# Patient Record
Sex: Female | Born: 1960
Health system: Southern US, Community
[De-identification: ages and names within clinical notes are randomized; demographics above are authoritative.]

## PROBLEM LIST (undated history)

## (undated) DIAGNOSIS — L719 Rosacea, unspecified: Secondary | ICD-10-CM

## (undated) DIAGNOSIS — D689 Coagulation defect, unspecified: Secondary | ICD-10-CM

## (undated) DIAGNOSIS — T7840XA Allergy, unspecified, initial encounter: Secondary | ICD-10-CM

## (undated) DIAGNOSIS — I82409 Acute embolism and thrombosis of unspecified deep veins of unspecified lower extremity: Secondary | ICD-10-CM

## (undated) HISTORY — DX: Allergy, unspecified, initial encounter: T78.40XA

## (undated) HISTORY — PX: BREAST CYST ASPIRATION: SHX578

## (undated) HISTORY — DX: Coagulation defect, unspecified: D68.9

---

## 2000-04-22 ENCOUNTER — Ambulatory Visit (HOSPITAL_COMMUNITY): Admission: RE | Admit: 2000-04-22 | Discharge: 2000-04-22 | Payer: Self-pay | Admitting: Family Medicine

## 2001-04-16 ENCOUNTER — Ambulatory Visit (HOSPITAL_COMMUNITY): Admission: RE | Admit: 2001-04-16 | Discharge: 2001-04-16 | Payer: Self-pay | Admitting: Family Medicine

## 2003-01-16 ENCOUNTER — Ambulatory Visit (HOSPITAL_COMMUNITY): Admission: RE | Admit: 2003-01-16 | Discharge: 2003-01-16 | Payer: Self-pay | Admitting: Family Medicine

## 2005-03-24 ENCOUNTER — Ambulatory Visit (HOSPITAL_COMMUNITY): Admission: RE | Admit: 2005-03-24 | Discharge: 2005-03-24 | Payer: Self-pay | Admitting: Family Medicine

## 2006-01-27 ENCOUNTER — Encounter: Admission: RE | Admit: 2006-01-27 | Discharge: 2006-01-27 | Payer: Self-pay | Admitting: Family Medicine

## 2011-03-24 ENCOUNTER — Emergency Department (HOSPITAL_BASED_OUTPATIENT_CLINIC_OR_DEPARTMENT_OTHER)
Admission: EM | Admit: 2011-03-24 | Discharge: 2011-03-24 | Disposition: A | Discharge status: Home / Self Care | Payer: 59 | Attending: Emergency Medicine | Admitting: Emergency Medicine

## 2011-03-24 ENCOUNTER — Encounter: Payer: Self-pay | Admitting: Student

## 2011-03-24 ENCOUNTER — Emergency Department (INDEPENDENT_AMBULATORY_CARE_PROVIDER_SITE_OTHER): Payer: 59

## 2011-03-24 DIAGNOSIS — M25579 Pain in unspecified ankle and joints of unspecified foot: Secondary | ICD-10-CM

## 2011-03-24 DIAGNOSIS — W19XXXA Unspecified fall, initial encounter: Secondary | ICD-10-CM

## 2011-03-24 DIAGNOSIS — W101XXA Fall (on)(from) sidewalk curb, initial encounter: Secondary | ICD-10-CM | POA: Insufficient documentation

## 2011-03-24 DIAGNOSIS — S92213A Displaced fracture of cuboid bone of unspecified foot, initial encounter for closed fracture: Secondary | ICD-10-CM | POA: Insufficient documentation

## 2011-03-24 DIAGNOSIS — S92253A Displaced fracture of navicular [scaphoid] of unspecified foot, initial encounter for closed fracture: Secondary | ICD-10-CM

## 2011-03-24 HISTORY — DX: Rosacea, unspecified: L71.9

## 2011-03-24 MED ORDER — HYDROCODONE-ACETAMINOPHEN 5-500 MG PO TABS: 1.0000 | ORAL_TABLET | Freq: Four times a day (QID) | ORAL | Status: AC | PRN

## 2011-03-24 NOTE — ED Provider Notes (Signed)
History     Chief Complaint  Patient presents with  . Foot Injury    left ankle   HPI Comments: Inversion injury to left ankle this morning.  [place]  The history is provided by the patient.    Past Medical History  Diagnosis Date  . Rosacea     History reviewed. No pertinent past surgical history.  History reviewed. No pertinent family history.  History  Substance Use Topics  . Smoking status: Never Smoker   . Smokeless tobacco: Never Used  . Alcohol Use: No    OB History    Grav Para Term Preterm Abortions TAB SAB Ect Mult Living                  Review of Systems  Constitutional: Negative for fever, chills, activity change and appetite change.  Musculoskeletal:       Pain, swelling to left ankle and foot.  Neurological: Negative for numbness.    Physical Exam  BP 118/71  Pulse 66  Temp(Src) 98 F (36.7 C) (Oral)  Resp 20  Wt 170 lb (77.111 kg)  LMP 02/12/2011  Physical Exam  Constitutional: She appears well-developed and well-nourished. No distress.  HENT:  Head: Normocephalic and atraumatic.  Musculoskeletal:       The left ankle is noted to have swelling, ecchymosis over the inferior aspect of the lateral malleolus and proximal foot.  There is no medial malleolar ttp.  Skin: She is not diaphoretic.    ED Course  Procedures  MDM Small avulsion fractures noted on the xrays.  Will place in a camwalker and crutches.      Riley Lam [facility] 03/25/11 [phone]

## 2011-03-24 NOTE — ED Notes (Signed)
Pt in with c/o left foot and ankle pain s/p stepping and twisting injury off of sidewalk.

## 2011-07-03 ENCOUNTER — Other Ambulatory Visit: Payer: Self-pay | Admitting: Obstetrics and Gynecology

## 2011-07-03 DIAGNOSIS — N63 Unspecified lump in unspecified breast: Secondary | ICD-10-CM

## 2011-07-16 ENCOUNTER — Ambulatory Visit
Admission: RE | Admit: 2011-07-16 | Discharge: 2011-07-16 | Disposition: A | Discharge status: Home / Self Care | Payer: 59 | Source: Ambulatory Visit | Attending: Obstetrics and Gynecology | Admitting: Obstetrics and Gynecology

## 2011-07-16 ENCOUNTER — Other Ambulatory Visit: Payer: Self-pay | Admitting: Obstetrics and Gynecology

## 2011-07-16 DIAGNOSIS — N63 Unspecified lump in unspecified breast: Secondary | ICD-10-CM

## 2011-07-21 ENCOUNTER — Ambulatory Visit
Admission: RE | Admit: 2011-07-21 | Discharge: 2011-07-21 | Disposition: A | Discharge status: Home / Self Care | Payer: 59 | Source: Ambulatory Visit | Attending: Obstetrics and Gynecology | Admitting: Obstetrics and Gynecology

## 2011-07-21 DIAGNOSIS — N63 Unspecified lump in unspecified breast: Secondary | ICD-10-CM

## 2012-07-12 ENCOUNTER — Other Ambulatory Visit: Payer: Self-pay | Admitting: Obstetrics and Gynecology

## 2012-07-12 DIAGNOSIS — N6322 Unspecified lump in the left breast, upper inner quadrant: Secondary | ICD-10-CM

## 2012-07-19 ENCOUNTER — Ambulatory Visit
Admission: RE | Admit: 2012-07-19 | Discharge: 2012-07-19 | Disposition: A | Discharge status: Home / Self Care | Payer: 59 | Source: Ambulatory Visit | Attending: Obstetrics and Gynecology | Admitting: Obstetrics and Gynecology

## 2012-07-19 DIAGNOSIS — N6322 Unspecified lump in the left breast, upper inner quadrant: Secondary | ICD-10-CM

## 2013-06-21 ENCOUNTER — Other Ambulatory Visit: Payer: Self-pay

## 2013-06-21 DIAGNOSIS — Z1231 Encounter for screening mammogram for malignant neoplasm of breast: Secondary | ICD-10-CM

## 2013-07-22 ENCOUNTER — Ambulatory Visit
Admission: RE | Admit: 2013-07-22 | Discharge: 2013-07-22 | Disposition: A | Discharge status: Home / Self Care | Payer: 59 | Source: Ambulatory Visit

## 2013-07-22 DIAGNOSIS — Z1231 Encounter for screening mammogram for malignant neoplasm of breast: Secondary | ICD-10-CM

## 2013-07-29 ENCOUNTER — Other Ambulatory Visit: Payer: Self-pay | Admitting: Obstetrics and Gynecology

## 2013-07-29 DIAGNOSIS — R928 Other abnormal and inconclusive findings on diagnostic imaging of breast: Secondary | ICD-10-CM

## 2013-08-15 ENCOUNTER — Ambulatory Visit
Admission: RE | Admit: 2013-08-15 | Discharge: 2013-08-15 | Disposition: A | Discharge status: Home / Self Care | Payer: 59 | Source: Ambulatory Visit | Attending: Obstetrics and Gynecology | Admitting: Obstetrics and Gynecology

## 2013-08-15 DIAGNOSIS — R928 Other abnormal and inconclusive findings on diagnostic imaging of breast: Secondary | ICD-10-CM

## 2015-09-20 ENCOUNTER — Telehealth: Payer: Self-pay | Admitting: General Practice

## 2015-09-20 NOTE — Telephone Encounter (Signed)
Relation to NW:GNFA Call back [phone]   Reason for call:  Patient would like to establish care with Dr. Beverely Low, patient is aware MD is relocating. Please advise

## 2015-09-20 NOTE — Telephone Encounter (Signed)
Ok to establish 

## 2015-09-20 NOTE — Telephone Encounter (Signed)
Patient scheduled for 09/21/2015

## 2015-09-21 ENCOUNTER — Ambulatory Visit (INDEPENDENT_AMBULATORY_CARE_PROVIDER_SITE_OTHER): Payer: 59 | Admitting: Family Medicine

## 2015-09-21 ENCOUNTER — Encounter: Payer: Self-pay | Admitting: Family Medicine

## 2015-09-21 ENCOUNTER — Ambulatory Visit (HOSPITAL_BASED_OUTPATIENT_CLINIC_OR_DEPARTMENT_OTHER)
Admission: RE | Admit: 2015-09-21 | Discharge: 2015-09-21 | Disposition: A | Discharge status: Home / Self Care | Payer: 59 | Source: Ambulatory Visit | Attending: Family Medicine | Admitting: Family Medicine

## 2015-09-21 VITALS — BP 140/79 | HR 82 | Temp 98.5°F | Resp 16 | Ht 69.0 in | Wt 195.0 lb

## 2015-09-21 DIAGNOSIS — I82442 Acute embolism and thrombosis of left tibial vein: Secondary | ICD-10-CM | POA: Insufficient documentation

## 2015-09-21 DIAGNOSIS — M25572 Pain in left ankle and joints of left foot: Secondary | ICD-10-CM | POA: Insufficient documentation

## 2015-09-21 DIAGNOSIS — M7989 Other specified soft tissue disorders: Secondary | ICD-10-CM | POA: Insufficient documentation

## 2015-09-21 DIAGNOSIS — I803 Phlebitis and thrombophlebitis of lower extremities, unspecified: Secondary | ICD-10-CM | POA: Diagnosis not present

## 2015-09-21 DIAGNOSIS — I80292 Phlebitis and thrombophlebitis of other deep vessels of left lower extremity: Secondary | ICD-10-CM

## 2015-09-21 DIAGNOSIS — I8002 Phlebitis and thrombophlebitis of superficial vessels of left lower extremity: Secondary | ICD-10-CM | POA: Diagnosis not present

## 2015-09-21 MED ORDER — IBUPROFEN 600 MG PO TABS: 600.0000 mg | ORAL_TABLET | Freq: Three times a day (TID) | ORAL | Status: DC | PRN

## 2015-09-21 MED ORDER — RIVAROXABAN (XARELTO) VTE STARTER PACK (15 & 20 MG): ORAL_TABLET | ORAL | Status: DC

## 2015-09-21 NOTE — Progress Notes (Signed)
   Subjective:    Patient ID: Hannah Day, female    DOB: Nov 05, 1960, 55 y.o.   MRN: [id number]  HPI New to establish.  Previous MD- Yehuda Budd.  GYN- Adkins    Clotting disorder- pt has hx of superficial clots.  Not currently on ASA.  Has never been on blood thinner.  Mom w/ hx of clots- pt thinks it was Factor V Leiden.  Now w/ area of soreness on L lower leg.  sxs started Monday night w/ pain in calf.  Then developed redness, warmth, TTP.  Calf is no longer hurting- pain and swelling has moved distally to medial malleolus.  No SOB, CP, palpitations, no fevers.  Taking Ibuprofen.  Pt reports 3-4 previous documented clots but possibility of many more.   Review of Systems For ROS see HPI     Objective:   Physical Exam  Constitutional: She is oriented to person, place, and time. She appears well-developed and well-nourished. No distress.  HENT:  Head: Normocephalic and atraumatic.  Eyes: Conjunctivae and EOM are normal. Pupils are equal, round, and reactive to light.  Cardiovascular: Normal rate, regular rhythm, normal heart sounds and intact distal pulses.   Pulmonary/Chest: Effort normal and breath sounds normal. No respiratory distress. She has no wheezes. She has no rales.  Musculoskeletal: She exhibits edema (L lower extremity edema w/ TTP and warmer than proximal lower leg) and tenderness.  Neurological: She is alert and oriented to person, place, and time.  Psychiatric: She has a normal mood and affect. Her behavior is normal. Thought content normal.  Vitals reviewed.         Assessment & Plan:

## 2015-09-21 NOTE — Patient Instructions (Signed)
You have an Ultrasound at 5:30 downstairs on the First Floor (right beside elevator) Start the Ibuprofen 3x/day (w/ food) for pain and inflammation Hot compresses to lower leg We'll call you with your hematology appt for the clotting workup Call with any questions or concerns Hang in there!!!

## 2015-09-21 NOTE — Progress Notes (Signed)
Pre visit review using our clinic review tool, if applicable. No additional management support is needed unless otherwise documented below in the visit note. 

## 2015-09-23 NOTE — Assessment & Plan Note (Signed)
New.  Pt reports hx of multiple clots and mother had clotting disorder.  Denies hx of DVT.  No records to review.  Will get venous doppler to assess superficial vs deep clot.  Will plan on NSAIDs for superficial clot and if DVT, will need xarelto.  Refer to hematology for complete evaluation and tx.  Pt expressed understanding and is in agreement w/ plan.

## 2015-09-24 ENCOUNTER — Ambulatory Visit: Payer: Self-pay | Admitting: Family Medicine

## 2015-10-09 ENCOUNTER — Other Ambulatory Visit: Payer: Self-pay

## 2015-10-09 ENCOUNTER — Telehealth: Payer: Self-pay | Admitting: Family Medicine

## 2015-10-09 NOTE — Telephone Encounter (Signed)
Called patient to see why she needeed another [employer]. Left message for cal back

## 2015-10-09 NOTE — Telephone Encounter (Addendum)
Relation to ZO:XWRU Call back [phone] Pharmacy: [id number] - [place], [place] - [address] [phone] (Phone) [phone] (Fax)         Reason for call:  Patient requesting a refill Rivaroxaban (XARELTO STARTER PACK) 15 & 20 MG

## 2015-10-10 NOTE — Telephone Encounter (Signed)
Patient not finished with starter pack. Advised when she gets to 2-3 days left of 20 mg, call pharmacy and they will contact us for refill.

## 2015-10-15 ENCOUNTER — Telehealth: Payer: Self-pay

## 2015-10-15 ENCOUNTER — Other Ambulatory Visit: Payer: Self-pay

## 2015-10-15 MED ORDER — RIVAROXABAN 20 MG PO TABS: 20.0000 mg | ORAL_TABLET | Freq: Every day | ORAL | Status: DC

## 2015-10-15 NOTE — Telephone Encounter (Signed)
Received refill request for XARELTO. May patient continue the medication. Was started on Starter pack

## 2015-10-15 NOTE — Telephone Encounter (Signed)
Pt now needs  once daily, #30, 3 refills

## 2015-10-15 NOTE — Telephone Encounter (Signed)
Ordered

## 2015-10-16 ENCOUNTER — Encounter (HOSPITAL_BASED_OUTPATIENT_CLINIC_OR_DEPARTMENT_OTHER): Payer: Self-pay | Admitting: *Deleted

## 2015-10-16 ENCOUNTER — Emergency Department (HOSPITAL_BASED_OUTPATIENT_CLINIC_OR_DEPARTMENT_OTHER)
Admission: EM | Admit: 2015-10-16 | Discharge: 2015-10-16 | Disposition: A | Discharge status: Home / Self Care | Payer: 59 | Attending: Emergency Medicine | Admitting: Emergency Medicine

## 2015-10-16 ENCOUNTER — Telehealth: Payer: Self-pay | Admitting: Family Medicine

## 2015-10-16 DIAGNOSIS — M791 Myalgia: Secondary | ICD-10-CM | POA: Diagnosis not present

## 2015-10-16 DIAGNOSIS — M79604 Pain in right leg: Secondary | ICD-10-CM | POA: Diagnosis present

## 2015-10-16 DIAGNOSIS — M79652 Pain in left thigh: Secondary | ICD-10-CM | POA: Insufficient documentation

## 2015-10-16 DIAGNOSIS — Z7901 Long term (current) use of anticoagulants: Secondary | ICD-10-CM | POA: Insufficient documentation

## 2015-10-16 DIAGNOSIS — Z862 Personal history of diseases of the blood and blood-forming organs and certain disorders involving the immune mechanism: Secondary | ICD-10-CM | POA: Insufficient documentation

## 2015-10-16 DIAGNOSIS — Z79899 Other long term (current) drug therapy: Secondary | ICD-10-CM | POA: Diagnosis not present

## 2015-10-16 DIAGNOSIS — M7989 Other specified soft tissue disorders: Secondary | ICD-10-CM | POA: Diagnosis not present

## 2015-10-16 DIAGNOSIS — Z872 Personal history of diseases of the skin and subcutaneous tissue: Secondary | ICD-10-CM | POA: Diagnosis not present

## 2015-10-16 DIAGNOSIS — R238 Other skin changes: Secondary | ICD-10-CM | POA: Insufficient documentation

## 2015-10-16 DIAGNOSIS — Z86718 Personal history of other venous thrombosis and embolism: Secondary | ICD-10-CM | POA: Diagnosis not present

## 2015-10-16 HISTORY — DX: Acute embolism and thrombosis of unspecified deep veins of unspecified lower extremity: I82.409

## 2015-10-16 NOTE — Discharge Instructions (Signed)
Place a warm damp washcloth over the area on your left thigh 4 times daily 30 minutes of time or sit in warm bathtub or let the warm water from the shower hit the area 4 times daily for 30 minutes at a time. Return or see your physician if you feel worse for any reason.

## 2015-10-16 NOTE — ED Provider Notes (Signed)
CSN: [id number]     Arrival date & time 10/16/15  1539 History   First MD Initiated Contact with Patient 10/16/15 1746     Chief Complaint  Patient presents with  . Leg Pain     (Consider location/radiation/quality/duration/timing/severity/associated sxs/prior Treatment) HPI Patient noted a "bulge" at her left proximal thigh yesterday,, causing mild discomfort when her jeans rub against her skin or when she presses on the area. She denies fever. She is concerned that she may have a blood clot as she's been diagnosed with DVT last month and has been on Xarelto for approximately one month. No other associated symptoms. No shortness of breath no chest pain no fever. She has no discomfort when she does not press on the area. Past Medical History  Diagnosis Date  . Rosacea   . Clotting disorder (HCC)   . DVT (deep venous thrombosis) (HCC)    History reviewed. No pertinent past surgical history. Family History  Problem Relation Age of Onset  . Cancer Father    Social History  Substance Use Topics  . Smoking status: Never Smoker   . Smokeless tobacco: Never Used  . Alcohol Use: No   OB History    No data available     Review of Systems  Constitutional: Negative.   HENT: Negative.   Respiratory: Negative.   Cardiovascular: Positive for leg swelling.       Chronic bilateral leg edema  Gastrointestinal: Negative.   Musculoskeletal: Positive for myalgias.       Left thigh pain  Skin: Negative.   Neurological: Negative.   Hematological: Bruises/bleeds easily.  Psychiatric/Behavioral: Negative.   All other systems reviewed and are negative.     Allergies  Sulfa antibiotics  Home Medications   Prior to Admission medications   Medication Sig Start Date End Date Taking? Authorizing Provider  Black Cohosh 40 MG CAPS Take 1 capsule by mouth 2 (two) times daily.   Yes Historical Provider, MD  doxycycline (ORACEA) 40 MG capsule Take 40 mg by mouth every morning.     Yes  Historical Provider, MD  Rivaroxaban (XARELTO STARTER PACK) 15 & 20 MG TBPK Start with one  tab by mouth twice a day w/ food. On Day 22, switch to one  tablet once a day w/ food. 09/21/15  Yes Sheliah Hatch, MD  ibuprofen (ADVIL,MOTRIN) 600 MG tablet Take 1 tablet (600 mg total) by mouth every 8 (eight) hours as needed. 09/21/15   Sheliah Hatch, MD  rivaroxaban (XARELTO) 20 MG TABS tablet Take 1 tablet (20 mg total) by mouth daily with supper. 10/15/15   Sheliah Hatch, MD   BP 148/93 mmHg  Pulse 78  Temp(Src) 98.2 F (36.8 C) (Oral)  Resp 18  Wt 190 lb (86.183 kg)  SpO2 100% Physical Exam  Constitutional: She appears well-developed and well-nourished.  HENT:  Head: Normocephalic and atraumatic.  Eyes: Conjunctivae and EOM are normal.  Neck: Neck supple.  Cardiovascular: Normal rate.   Pulmonary/Chest: Effort normal. No respiratory distress.  Abdominal: She exhibits no distension.  Musculoskeletal: Normal range of motion. She exhibits edema.  1+ pretibial edema bilaterally. DP pulses 2+ bilaterally Lower extremity there is a single pimple located at the proximal thigh 3 cm distal to the inguinal crease which is minimally tender, no surrounding redness warmth or tenderness. All other extremities without redness  or tenderness neurovascular intact  Neurological: She is alert. Coordination normal.  Skin: Skin is warm and dry. No rash noted.  Psychiatric: She has a normal mood and affect.  Nursing note and vitals reviewed.   ED Course  Procedures (including critical care time) Labs Review Labs Reviewed - No data to display  Imaging Review No results found. I have personally reviewed and evaluated these images and lab results as part of my medical decision-making.   EKG Interpretation None      MDM  Patient could not visualize the area on her left thigh and she is satisfied there is a pimple there. Does not feel like prior blood clot. Does not require I&D.  Area is tiny 1 or 2 mm. Suggest warm compresses. Return or follow up with Dr.Tabouri as needed. Final diagnoses:  None        Doug Sou, MD 10/16/15 1801

## 2015-10-16 NOTE — Telephone Encounter (Signed)
[facility] - Client TELEPHONE ADVICE RECORD [facility] Patient Name: Hannah Day DOB: [birth year] Initial Comment Caller states she was prescribed Xarelto for blood clot in leg last month, has hard raised vein and leg pain again Nurse Assessment Nurse: Lane Hacker, RN, Windy Date/Time (Eastern Time): 10/16/2015 2:36:00 PM Confirm and document reason for call. If symptomatic, describe symptoms. You must click the next button to save text entered. ---Caller states she was prescribed Xarelto for blood clot in left leg last month. She has hard raised vein and leg pain in the opposite leg now -- in upper thigh of right leg. There is a little bruising around it. No cp or sob. Has the patient traveled out of the country within the last 30 days? ---Not Applicable Does the patient have any new or worsening symptoms? ---Yes Will a triage be completed? ---Yes Related visit to physician within the last 2 weeks? ---No Does the PT have any chronic conditions? (i.e. diabetes, asthma, etc.) ---Yes List chronic conditions. ---DVT 2017, superficial blood clots in the past Is the patient pregnant or possibly pregnant? (Ask all females between the ages of 9-55) ---No Is this a behavioral health or substance abuse call? ---No Guidelines Guideline Title Affirmed Question Affirmed Notes Leg Pain History of prior "blood clot" in leg or lungs (i.e., deep vein thrombosis, pulmonary embolism) Final Disposition User See Physician within 4 Hours (or PCP triage) Lane Hacker, RN, Elvin So Comments Midly tender to touch, and otherwise no real pain even when walking. Area is raised with a slight bulge. No available appts today. - checked with office. They spoke with Dr. Alfonso Ramus and she advised that pt go to ER as she may need a different blood thinner. -- As advised by Marj at office Referrals GO TO FACILITY UNDECIDED Disagree/Comply: Comply

## 2015-10-16 NOTE — ED Notes (Signed)
Patient complaining of bulging area in right lower groin. Pt states from what she can see it looks bruised.

## 2015-10-17 ENCOUNTER — Ambulatory Visit: Payer: 59

## 2015-10-17 ENCOUNTER — Ambulatory Visit: Payer: 59 | Admitting: Hematology & Oncology

## 2015-10-17 ENCOUNTER — Other Ambulatory Visit: Payer: 59

## 2015-10-18 ENCOUNTER — Other Ambulatory Visit: Payer: Self-pay

## 2015-10-22 ENCOUNTER — Ambulatory Visit: Payer: 59 | Admitting: Hematology & Oncology

## 2015-10-22 ENCOUNTER — Other Ambulatory Visit: Payer: 59

## 2015-10-22 ENCOUNTER — Ambulatory Visit: Payer: 59

## 2015-10-29 ENCOUNTER — Ambulatory Visit (HOSPITAL_BASED_OUTPATIENT_CLINIC_OR_DEPARTMENT_OTHER): Payer: 59 | Admitting: Hematology & Oncology

## 2015-10-29 ENCOUNTER — Encounter: Payer: Self-pay | Admitting: Hematology & Oncology

## 2015-10-29 ENCOUNTER — Ambulatory Visit: Payer: 59

## 2015-10-29 ENCOUNTER — Other Ambulatory Visit (HOSPITAL_BASED_OUTPATIENT_CLINIC_OR_DEPARTMENT_OTHER): Payer: 59

## 2015-10-29 VITALS — BP 145/73 | HR 65 | Temp 98.0°F | Resp 16 | Ht 69.0 in | Wt 196.0 lb

## 2015-10-29 DIAGNOSIS — I803 Phlebitis and thrombophlebitis of lower extremities, unspecified: Secondary | ICD-10-CM | POA: Diagnosis not present

## 2015-10-29 DIAGNOSIS — I80292 Phlebitis and thrombophlebitis of other deep vessels of left lower extremity: Secondary | ICD-10-CM

## 2015-10-29 DIAGNOSIS — I824Z2 Acute embolism and thrombosis of unspecified deep veins of left distal lower extremity: Secondary | ICD-10-CM | POA: Insufficient documentation

## 2015-10-29 LAB — COMPREHENSIVE METABOLIC PANEL (CC13)
ALK PHOS: 80 IU/L (ref 39–117)
ALT: 19 IU/L (ref 0–32)
AST: 16 IU/L (ref 0–40)
Albumin, Serum: 4.1 g/dL (ref 3.5–5.5)
Albumin/Globulin Ratio: 1.5 (ref 1.1–2.5)
BUN/Creatinine Ratio: 18 (ref 9–23)
BUN: 14 mg/dL (ref 6–24)
Bilirubin Total: 0.2 mg/dL (ref 0.0–1.2)
CALCIUM: 9.1 mg/dL (ref 8.7–10.2)
CO2: 25 mmol/L (ref 18–29)
CREATININE: 0.76 mg/dL (ref 0.57–1.00)
Chloride, Ser: 104 mmol/L (ref 96–106)
GFR calc Af Amer: 103 mL/min/{1.73_m2} (ref >59)
GFR, EST NON AFRICAN AMERICAN: 89 mL/min/{1.73_m2} (ref >59)
GLOBULIN, TOTAL: 2.8 g/dL (ref 1.5–4.5)
GLUCOSE: 92 mg/dL (ref 65–99)
Potassium, Ser: 3.7 mmol/L (ref 3.5–5.2)
SODIUM: 139 mmol/L (ref 134–144)
Total Protein: 6.9 g/dL (ref 6.0–8.5)

## 2015-10-29 LAB — CBC WITH DIFFERENTIAL (CANCER CENTER ONLY)
BASO#: 0 10*3/uL (ref 0.0–0.2)
BASO%: 0.6 % (ref 0.0–2.0)
EOS ABS: 0.1 10*3/uL (ref 0.0–0.5)
EOS%: 1.9 % (ref 0.0–7.0)
HCT: 38.7 % (ref 34.8–46.6)
HEMOGLOBIN: 12.6 g/dL (ref 11.6–15.9)
LYMPH#: 2.8 10*3/uL (ref 0.9–3.3)
LYMPH%: 43.7 % (ref 14.0–48.0)
MCH: 30.7 pg (ref 26.0–34.0)
MCHC: 32.6 g/dL (ref 32.0–36.0)
MCV: 94 fL (ref 81–101)
MONO#: 0.5 10*3/uL (ref 0.1–0.9)
MONO%: 7.3 % (ref 0.0–13.0)
NEUT#: 3 10*3/uL (ref 1.5–6.5)
NEUT%: 46.5 % (ref 39.6–80.0)
Platelets: 224 10*3/uL (ref 145–400)
RBC: 4.1 10*6/uL (ref 3.70–5.32)
RDW: 13.2 % (ref 11.1–15.7)
WBC: 6.4 10*3/uL (ref 3.9–10.0)

## 2015-10-29 NOTE — Progress Notes (Signed)
Referral MD  Reason for Referral: Recurrent superficial thrombosis of the legs with new deep vein thrombosis of the left leg.   Chief Complaint  Patient presents with  . OTHER    New Patient  : I now have a blood clot in my left leg.  HPI: Hannah Day is a very charming 55 year old white female. I actually took care of her mom who passed away from myelofibrosis. Her mom had protein S deficiency.  Hannah Day has had superficial thrombosis in her legs. She's had 4 episodes. She probably has had these over a span of about 20 years.  She has had 2 pregnancies. Both went to term area and she's never had miscarriages.  Recently, she had some pain and swelling in the left leg. She thought that she had another superficial thrombus. She underwent a Doppler. Surprisingly enough, the Doppler did show a superficial thrombus but this seemed to extend into the Left posterior tibial vein. This appear to be isolated into the vein and not into the thigh. The Doppler was done on January 20.  She was placed on Xarelto. She's done well. Her leg is swollen. It is not as painful.  Because of the family history with her mother, is felt that Hannah Day needed be seen by hematology. As such, she is kindly referred to the cancer Center for an evaluation.  She's working without difficulty. She does travel. She has were compression stockings when she travels.  She's not smoking. She does not take any estrogens. She said that her first superficial thrombus occurred after taking some estrogens.  She does try to stay active. She does not exercise much that she would like.  She's had no surgery. Her gynecologic organs are still in place.  She's had no weight loss or weight gain.  There is no other history of thrombosis in the family.  Her father passed away last 01-Jun-2023 of lung cancer.  She does get her mammograms.  Overall, her performance status is ECOG 0.                Past Medical  History  Diagnosis Date  . Rosacea   . Clotting disorder (HCC)   . DVT (deep venous thrombosis) (HCC)   :  No past surgical history on file.:   Current outpatient prescriptions:  .  acetaminophen (TYLENOL) 500 MG tablet, Take 500 mg by mouth every 6 (six) hours as needed., Disp: , Rfl:  .  Black Cohosh 40 MG CAPS, Take 1 capsule by mouth 2 (two) times daily., Disp: , Rfl:  .  cetirizine (ZYRTEC) 10 MG tablet, Take 10 mg by mouth as needed for allergies., Disp: , Rfl:  .  doxycycline (ORACEA) 40 MG capsule, Take 40 mg by mouth every morning.  , Disp: , Rfl:  .  rivaroxaban (XARELTO) 20 MG TABS tablet, Take 1 tablet (20 mg total) by mouth daily with supper., Disp: 30 tablet, Rfl: 3:  :  Allergies  Allergen Reactions  . Sulfa Antibiotics Hives  :  Family History  Problem Relation Age of Onset  . Cancer Father   :  Social History   Social History  . Marital Status: Married    Spouse Name: N/A  . Number of Children: N/A  . Years of Education: N/A   Occupational History  . Not on file.   Social History Main Topics  . Smoking status: Never Smoker   . Smokeless tobacco: Never Used  . Alcohol Use: No  .  Drug Use: No  . Sexual Activity: Yes   Other Topics Concern  . Not on file   Social History Narrative  :  Pertinent items are noted in HPI.  Exam: @  well-developed and well-nourished white female in no obvious distress. Vital signs show temperature of 98. Pulse 65. Blood pressure 145/73. Weight is 196 pounds. Head and neck exam shows no ocular or oral lesions. She has no palpable cervical or supraclavicular lymph nodes. Lungs are clear. Cardiac exam regular rate and rhythm with no murmurs, rubs or bruits. Abdomen is soft. She has good out sounds. There is no fluid wave. There is no palpable hepatosplenomegaly. Back exam shows no tenderness over the spine, ribs or hips. Extremities shows no clubbing, cyanosis or edema. She has no palpable venous cord in the  legs. She has negative Homans sign. She has good pulses in her distal extremities. Skin exam shows no rashes, ecchymoses or petechia. Neurological exam shows no focal neurological deficits.    Recent Labs  10/29/15 1449  WBC 6.4  HGB 12.6  HCT 38.7  PLT 224   No results for input(s): NA, K, CL, CO2, GLUCOSE, BUN, CREATININE, CALCIUM in the last 72 hours.  Blood smear review:  None  Pathology: None     Assessment and Plan:  Hannah Day is a very charming 55 year old white female. She has a thrombus in the left posterior tibial vein. She has a history of superficial thrombosis.  She is on Xarelto.  He'll be interesting to see what her hypercoagulable studies show. Again, I think her mom had protein S deficiency.  As far as duration of anticoagulation, I think that 6 months probably would be reasonable given that there probably is a thrombophilic condition. I would not put her on lifelong anticoagulation as she is only had superficial thrombosis prior to this.  I spent about 45 minutes with her. It was nice talking to her. It has been a while since I have seen her.  I will plan to see her back in 2 months. I will get a Doppler we see her back.

## 2015-10-30 LAB — CARDIOLIPIN ANTIBODIES, IGG, IGM, IGA: Anticardiolipin Ab,IgA,Qn: <9 APL U/mL (ref 0–11)

## 2015-10-31 LAB — PROTEIN C, TOTAL: PROTEIN C ANTIGEN: 86 % (ref 60–150)

## 2015-10-31 LAB — LUPUS ANTICOAGULANT PANEL
PTT-LA: 39.8 s (ref 0.0–43.6)
dRVVT Confirm: 1.5 ratio — ABNORMAL HIGH (ref 0.8–1.2)
dRVVT Mix: 75.5 s — ABNORMAL HIGH (ref 0.0–44.0)
dRVVT: 103.2 s — ABNORMAL HIGH (ref 0.0–44.0)

## 2015-10-31 LAB — ANTITHROMBIN III: Antithrombin Activity: 162 % — ABNORMAL HIGH (ref 75–135)

## 2015-10-31 LAB — BETA-2-GLYCOPROTEIN I ABS, IGG/M/A
Beta-2 Glyco 1 IgA: <9 GPI IgA units (ref 0–25)
Beta-2 Glyco 1 IgM: <9 GPI IgM units (ref 0–32)

## 2015-10-31 LAB — PROTEIN C ACTIVITY: Protein C-Functional: 122 % (ref 73–180)

## 2015-10-31 LAB — PROTEIN S ACTIVITY: Protein S-Functional: 253 % — ABNORMAL HIGH (ref 63–140)

## 2015-10-31 LAB — PROTEIN S, TOTAL: PROTEIN S AG TOTAL: 163 % — AB (ref 60–150)

## 2015-11-02 LAB — FACTOR 5 LEIDEN

## 2015-11-05 LAB — PROTHROMBIN GENE MUTATION

## 2015-11-06 ENCOUNTER — Encounter: Payer: Self-pay | Admitting: Hematology & Oncology

## 2015-12-26 ENCOUNTER — Other Ambulatory Visit (HOSPITAL_BASED_OUTPATIENT_CLINIC_OR_DEPARTMENT_OTHER): Payer: 59

## 2015-12-26 ENCOUNTER — Ambulatory Visit (HOSPITAL_BASED_OUTPATIENT_CLINIC_OR_DEPARTMENT_OTHER)
Admission: RE | Admit: 2015-12-26 | Discharge: 2015-12-26 | Disposition: A | Discharge status: Home / Self Care | Payer: 59 | Source: Ambulatory Visit | Attending: Hematology & Oncology | Admitting: Hematology & Oncology

## 2015-12-26 ENCOUNTER — Ambulatory Visit (HOSPITAL_BASED_OUTPATIENT_CLINIC_OR_DEPARTMENT_OTHER): Payer: 59 | Admitting: Hematology & Oncology

## 2015-12-26 ENCOUNTER — Encounter: Payer: Self-pay | Admitting: Hematology & Oncology

## 2015-12-26 VITALS — BP 140/65 | HR 76 | Temp 98.3°F | Wt 198.0 lb

## 2015-12-26 DIAGNOSIS — I824Z1 Acute embolism and thrombosis of unspecified deep veins of right distal lower extremity: Secondary | ICD-10-CM | POA: Diagnosis not present

## 2015-12-26 DIAGNOSIS — I80292 Phlebitis and thrombophlebitis of other deep vessels of left lower extremity: Secondary | ICD-10-CM

## 2015-12-26 DIAGNOSIS — I803 Phlebitis and thrombophlebitis of lower extremities, unspecified: Secondary | ICD-10-CM | POA: Diagnosis present

## 2015-12-26 DIAGNOSIS — I82812 Embolism and thrombosis of superficial veins of left lower extremities: Secondary | ICD-10-CM | POA: Insufficient documentation

## 2015-12-26 DIAGNOSIS — I824Z2 Acute embolism and thrombosis of unspecified deep veins of left distal lower extremity: Secondary | ICD-10-CM

## 2015-12-26 LAB — COMPREHENSIVE METABOLIC PANEL
ALK PHOS: 79 U/L (ref 40–150)
ALT: 15 U/L (ref 0–55)
ANION GAP: 6 meq/L (ref 3–11)
AST: 21 U/L (ref 5–34)
Albumin: 3.4 g/dL — ABNORMAL LOW (ref 3.5–5.0)
BUN: 10.1 mg/dL (ref 7.0–26.0)
CO2: 26 mEq/L (ref 22–29)
CREATININE: 0.8 mg/dL (ref 0.6–1.1)
Calcium: 9.3 mg/dL (ref 8.4–10.4)
Chloride: 108 mEq/L (ref 98–109)
EGFR: 86 mL/min/{1.73_m2} — AB (ref >90)
Glucose: 88 mg/dl (ref 70–140)
POTASSIUM: 4.2 meq/L (ref 3.5–5.1)
Sodium: 141 mEq/L (ref 136–145)
Total Bilirubin: 0.3 mg/dL (ref 0.20–1.20)
Total Protein: 6.6 g/dL (ref 6.4–8.3)

## 2015-12-26 LAB — CBC WITH DIFFERENTIAL (CANCER CENTER ONLY)
BASO#: 0 10*3/uL (ref 0.0–0.2)
BASO%: 0.5 % (ref 0.0–2.0)
EOS ABS: 0.1 10*3/uL (ref 0.0–0.5)
EOS%: 1.9 % (ref 0.0–7.0)
HCT: 38.7 % (ref 34.8–46.6)
HGB: 12.6 g/dL (ref 11.6–15.9)
LYMPH#: 2.5 10*3/uL (ref 0.9–3.3)
LYMPH%: 42.1 % (ref 14.0–48.0)
MCH: 31.2 pg (ref 26.0–34.0)
MCHC: 32.6 g/dL (ref 32.0–36.0)
MCV: 96 fL (ref 81–101)
MONO#: 0.5 10*3/uL (ref 0.1–0.9)
MONO%: 9.1 % (ref 0.0–13.0)
NEUT#: 2.8 10*3/uL (ref 1.5–6.5)
NEUT%: 46.4 % (ref 39.6–80.0)
PLATELETS: 221 10*3/uL (ref 145–400)
RBC: 4.04 10*6/uL (ref 3.70–5.32)
RDW: 13.2 % (ref 11.1–15.7)
WBC: 5.9 10*3/uL (ref 3.9–10.0)

## 2015-12-26 NOTE — Progress Notes (Signed)
Hematology and Oncology Follow Up Visit  Hannah Day [id number] 10/18/1960 55 y.o. 12/26/2015   Principle Diagnosis:   DVT of the right lower leg  Lupus anticoagulant positive  Current Therapy:    Xarelto 20 mg by mouth daily  Aspirin 81 mg by mouth daily     Interim History:  Ms. Hannah Day is back for follow-up. I first saw her back in February. She is doing pretty well. We did check for any type of thrombophilic state. She does not have protein S deficiency. Her mother had this. She did have a positive lupus anticoagulant. I'm not sure what exactly this implies as she does had one positive test. I will have to repeat this in the future. However, I did go and get her on low-dose aspirin.  Her right leg still has a little bit of swelling., Sure how much we can do to help with this.  She is traveling quite a bit with her job. She's had no problems with Xarelto. She does wear a compression stocking when she travels.  She's had no issues with bleeding.  Overall, her performance status is ECOG 1.  Medications:  Current outpatient prescriptions:  .  acetaminophen (TYLENOL) 500 MG tablet, Take 500 mg by mouth every 6 (six) hours as needed., Disp: , Rfl:  .  aspirin 81 MG chewable tablet, Chew 81 mg by mouth daily., Disp: , Rfl:  .  Black Cohosh 40 MG CAPS, Take 1 capsule by mouth 2 (two) times daily., Disp: , Rfl:  .  cetirizine (ZYRTEC) 10 MG tablet, Take 10 mg by mouth as needed for allergies., Disp: , Rfl:  .  dextromethorphan-guaiFENesin (MUCINEX DM) 30-600 MG 12hr tablet, Take 1 tablet by mouth as needed for cough., Disp: , Rfl:  .  doxycycline (ORACEA) 40 MG capsule, Take 40 mg by mouth every morning.  , Disp: , Rfl:  .  rivaroxaban (XARELTO) 20 MG TABS tablet, Take 1 tablet (20 mg total) by mouth daily with supper., Disp: 30 tablet, Rfl: 3  Allergies:  Allergies  Allergen Reactions  . Sulfa Antibiotics Hives    Past Medical History, Surgical history, Social history,  and Family History were reviewed and updated.  Review of Systems: As above  Physical Exam:  weight is 198 lb (89.812 kg). Her oral temperature is 98.3 F (36.8 C). Her blood pressure is 140/65 and her pulse is 76.   Wt Readings from Last 3 Encounters:  12/26/15 198 lb (89.812 kg)  10/29/15 196 lb (88.905 kg)  10/16/15 190 lb (86.183 kg)     Well-developed and well-nourished white female in no obvious distress. Head and neck exam shows no ocular or oral lesions. There are no palpable cervical or supraclavicular lymph nodes. Lungs are clear bilaterally. Cardiac exam regular rate and rhythm with no murmurs, rubs or bruits. Abdomen is soft. She has good bowel sounds. There is no fluid wave. There is no palpable liver or spleen tip. Active exam shows no tenderness over the spine, ribs or hips. Extremity shows mild nonpitting edema of the right lower leg. No venous cord is noted. She has good range of motion of her joints. She has good pulses in her distal extremities. Skin exam shows no rashes, ecchymoses or petechia. Neurological exam shows no focal neurological deficits.  Lab Results  Component Value Date   WBC 5.9 12/26/2015   HGB 12.6 12/26/2015   HCT 38.7 12/26/2015   MCV 96 12/26/2015   PLT 221 12/26/2015  Chemistry      Component Value Date/Time   NA 139 10/29/2015 1449   K 3.7 10/29/2015 1449   CL 104 10/29/2015 1449   CO2 25 10/29/2015 1449   BUN 14 10/29/2015 1449   CREATININE 0.76 10/29/2015 1449      Component Value Date/Time   CALCIUM 9.1 10/29/2015 1449   ALKPHOS 80 10/29/2015 1449   AST 16 10/29/2015 1449   ALT 19 10/29/2015 1449   BILITOT 0.2 10/29/2015 1449         Impression and Plan: Ms. Hannah Day is a 55 year old white female. She has a thrombus in the right lower leg. This has resolved. She does has a superficial thrombus now.  My plan is to have her on full dose Xarelto for 6 months. She will finish up in July. After this, I probably will get her  on low dose Xarelto at 10 mg daily.  I will have to repeat her lupus anticoagulant study when I see her back. If she is still positive when we see her back, that she will need low-dose aspirin.  I will like to see her back in 3 months.  I don't think we had to do any scans or Dopplers on her.  I spent about 25 minutes with her.   Josph Macho, MD 4/26/201710:47 AM

## 2016-02-10 ENCOUNTER — Other Ambulatory Visit: Payer: Self-pay | Admitting: Family Medicine

## 2016-02-11 NOTE — Telephone Encounter (Signed)
Pt established as a new pt in January, how many refills and when do you need her back in to be seen?

## 2016-02-11 NOTE — Telephone Encounter (Signed)
Pt should contact Dr Gustavo LahEnnever's office as he is managing her clot

## 2016-03-12 ENCOUNTER — Encounter: Payer: Self-pay | Admitting: Hematology & Oncology

## 2016-03-12 ENCOUNTER — Ambulatory Visit (HOSPITAL_BASED_OUTPATIENT_CLINIC_OR_DEPARTMENT_OTHER): Payer: 59 | Admitting: Hematology & Oncology

## 2016-03-12 ENCOUNTER — Other Ambulatory Visit (HOSPITAL_BASED_OUTPATIENT_CLINIC_OR_DEPARTMENT_OTHER): Payer: 59

## 2016-03-12 VITALS — BP 121/79 | HR 70 | Temp 98.3°F | Resp 18 | Ht 69.0 in | Wt 196.0 lb

## 2016-03-12 DIAGNOSIS — I824Z1 Acute embolism and thrombosis of unspecified deep veins of right distal lower extremity: Secondary | ICD-10-CM

## 2016-03-12 DIAGNOSIS — I82409 Acute embolism and thrombosis of unspecified deep veins of unspecified lower extremity: Secondary | ICD-10-CM

## 2016-03-12 DIAGNOSIS — I824Z2 Acute embolism and thrombosis of unspecified deep veins of left distal lower extremity: Secondary | ICD-10-CM

## 2016-03-12 LAB — LACTATE DEHYDROGENASE: LDH: 182 U/L (ref 125–245)

## 2016-03-12 LAB — COMPREHENSIVE METABOLIC PANEL
ALT: 15 U/L (ref 0–55)
ANION GAP: 8 meq/L (ref 3–11)
AST: 17 U/L (ref 5–34)
Albumin: 3.5 g/dL (ref 3.5–5.0)
Alkaline Phosphatase: 92 U/L (ref 40–150)
BILIRUBIN TOTAL: 0.39 mg/dL (ref 0.20–1.20)
BUN: 14.6 mg/dL (ref 7.0–26.0)
CALCIUM: 9.5 mg/dL (ref 8.4–10.4)
CO2: 27 meq/L (ref 22–29)
CREATININE: 0.8 mg/dL (ref 0.6–1.1)
Chloride: 107 mEq/L (ref 98–109)
EGFR: 85 mL/min/{1.73_m2} — ABNORMAL LOW (ref >90)
Glucose: 79 mg/dl (ref 70–140)
Potassium: 4 mEq/L (ref 3.5–5.1)
Sodium: 141 mEq/L (ref 136–145)
TOTAL PROTEIN: 7 g/dL (ref 6.4–8.3)

## 2016-03-12 LAB — CBC WITH DIFFERENTIAL (CANCER CENTER ONLY)
BASO#: 0 10*3/uL (ref 0.0–0.2)
BASO%: 0.5 % (ref 0.0–2.0)
EOS%: 1.8 % (ref 0.0–7.0)
Eosinophils Absolute: 0.1 10*3/uL (ref 0.0–0.5)
HCT: 40.1 % (ref 34.8–46.6)
HGB: 12.8 g/dL (ref 11.6–15.9)
LYMPH#: 2.3 10*3/uL (ref 0.9–3.3)
LYMPH%: 41.4 % (ref 14.0–48.0)
MCH: 30.7 pg (ref 26.0–34.0)
MCHC: 31.9 g/dL — AB (ref 32.0–36.0)
MCV: 96 fL (ref 81–101)
MONO#: 0.4 10*3/uL (ref 0.1–0.9)
MONO%: 6.7 % (ref 0.0–13.0)
NEUT%: 49.6 % (ref 39.6–80.0)
NEUTROS ABS: 2.8 10*3/uL (ref 1.5–6.5)
Platelets: 226 10*3/uL (ref 145–400)
RBC: 4.17 10*6/uL (ref 3.70–5.32)
RDW: 12.9 % (ref 11.1–15.7)
WBC: 5.7 10*3/uL (ref 3.9–10.0)

## 2016-03-12 MED ORDER — RIVAROXABAN 10 MG PO TABS: 10.0000 mg | ORAL_TABLET | Freq: Every day | ORAL | Status: DC

## 2016-03-12 NOTE — Progress Notes (Signed)
Hematology and Oncology Follow Up Visit  Verta Ellenaja G Bohnsack [id number] 02/03/1961 55 y.o. 03/12/2016   Principle Diagnosis:   DVT of the right lower leg  Lupus anticoagulant positive  Current Therapy:    Xarelto 20 mg by mouth daily  Aspirin 81 mg by mouth daily     Interim History:  [name] is back for follow-up. She is doing quite well. Her right leg really does not bother her all that much. She does travel quite a bit. She feels that the anticoagulation is helping her with her travels and not cause her to have as many problems with her legs.  She'll finish up 6 months of full dose Xarelto this month. Afterwards, we will get her on 10 mg a day. I will continue her on the aspirin at a low-dose.  She has had no problems with bleeding or bruising. There's been no change in bowel or bladder habits. She's had no chest wall pain. She's had no cough. She's had no rashes.  Overall, her performance status is ECOG 1.  Medications:  Current outpatient prescriptions:  .  acetaminophen (TYLENOL) 500 MG tablet, Take 500 mg by mouth every 6 (six) hours as needed., Disp: , Rfl:  .  aspirin 81 MG chewable tablet, Chew 81 mg by mouth daily., Disp: , Rfl:  .  Black Cohosh 40 MG CAPS, Take 1 capsule by mouth 2 (two) times daily., Disp: , Rfl:  .  cetirizine (ZYRTEC) 10 MG tablet, Take 10 mg by mouth as needed for allergies., Disp: , Rfl:  .  dextromethorphan-guaiFENesin (MUCINEX DM) 30-600 MG 12hr tablet, Take 1 tablet by mouth as needed for cough., Disp: , Rfl:  .  doxycycline (ORACEA) 40 MG capsule, Take 40 mg by mouth every morning.  , Disp: , Rfl:  .  XARELTO 20 MG TABS tablet, TAKE 1 TABLET(20 MG) BY MOUTH DAILY WITH SUPPER, Disp: 30 tablet, Rfl: 0  Allergies:  Allergies  Allergen Reactions  . Sulfa Antibiotics Hives    Past Medical History, Surgical history, Social history, and Family History were reviewed and updated.  Review of Systems: As above  Physical Exam:  height is 5'  9" (1.753 m) and weight is 196 lb (88.905 kg). Her oral temperature is 98.3 F (36.8 C). Her blood pressure is 121/79 and her pulse is 70. Her respiration is 18.   Wt Readings from Last 3 Encounters:  03/12/16 196 lb (88.905 kg)  12/26/15 198 lb (89.812 kg)  10/29/15 196 lb (88.905 kg)     Well-developed and well-nourished white female in no obvious distress. Head and neck exam shows no ocular or oral lesions. There are no palpable cervical or supraclavicular lymph nodes. Lungs are clear bilaterally. Cardiac exam regular rate and rhythm with no murmurs, rubs or bruits. Abdomen is soft. She has good bowel sounds. There is no fluid wave. There is no palpable liver or spleen tip. Active exam shows no tenderness over the spine, ribs or hips. Extremity shows mild nonpitting edema of the right lower leg. No venous cord is noted. She has good range of motion of her joints. She has good pulses in her distal extremities. Skin exam shows no rashes, ecchymoses or petechia. Neurological exam shows no focal neurological deficits.  Lab Results  Component Value Date   WBC 5.7 03/12/2016   HGB 12.8 03/12/2016   HCT 40.1 03/12/2016   MCV 96 03/12/2016   PLT 226 03/12/2016     Chemistry  Component Value Date/Time   NA 141 12/26/2015 0934   NA 139 10/29/2015 1449   K 4.2 12/26/2015 0934   K 3.7 10/29/2015 1449   CL 104 10/29/2015 1449   CO2 26 12/26/2015 0934   CO2 25 10/29/2015 1449   BUN 10.1 12/26/2015 0934   BUN 14 10/29/2015 1449   CREATININE 0.8 12/26/2015 0934   CREATININE 0.76 10/29/2015 1449      Component Value Date/Time   CALCIUM 9.3 12/26/2015 0934   CALCIUM 9.1 10/29/2015 1449   ALKPHOS 79 12/26/2015 0934   ALKPHOS 80 10/29/2015 1449   AST 21 12/26/2015 0934   AST 16 10/29/2015 1449   ALT 15 12/26/2015 0934   ALT 19 10/29/2015 1449   BILITOT 0.30 12/26/2015 0934   BILITOT 0.2 10/29/2015 1449         Impression and Plan: Ms. Renwick is a 55 year old white female.  She has a thrombus in the right lower leg. This has resolved. She does has a superficial thrombus now.  I will go ahead and switch her over to 10 mg a day of Xarelto. The latest studies seem to suggest that 1 year of low-dose Xarelto after full dose anticoagulation really improves the risk of relapse. The risk of bleeding is only about 3%.  I will see her back in about 4 months now. She's done very well. I don't see that we have to do any Dopplers on her.   I spent about 25 minutes with her.   Josph Macho, MD 7/12/20179:27 AM

## 2016-03-13 LAB — D-DIMER, QUANTITATIVE: D-DIMER: 0.24 mg/L FEU (ref 0.00–0.49)

## 2016-03-14 LAB — LUPUS ANTICOAGULANT PANEL
DRVVT CONFIRM: 1.9 ratio — AB (ref 0.8–1.2)
DRVVT MIX: 83.6 s — AB (ref 0.0–47.0)
DRVVT: 137.4 s — AB (ref 0.0–47.0)
PTT-LA: 43.1 s (ref 0.0–51.9)

## 2016-03-14 LAB — CARDIOLIPIN ANTIBODIES, IGG, IGM, IGA
Anticardiolipin Ab,IgA,Qn: <9 APL U/mL (ref 0–11)
Anticardiolipin Ab,IgG,Qn: <9 GPL U/mL (ref 0–14)
Anticardiolipin Ab,IgM,Qn: <9 MPL U/mL (ref 0–12)

## 2016-04-14 ENCOUNTER — Other Ambulatory Visit: Payer: Self-pay | Admitting: Nurse Practitioner

## 2016-04-14 DIAGNOSIS — I824Z2 Acute embolism and thrombosis of unspecified deep veins of left distal lower extremity: Secondary | ICD-10-CM

## 2016-04-21 ENCOUNTER — Encounter: Payer: Self-pay | Admitting: Nurse Practitioner

## 2016-04-21 ENCOUNTER — Other Ambulatory Visit: Payer: Self-pay | Admitting: Hematology & Oncology

## 2016-04-21 DIAGNOSIS — I825Z2 Chronic embolism and thrombosis of unspecified deep veins of left distal lower extremity: Secondary | ICD-10-CM

## 2016-04-21 MED ORDER — APIXABAN 2.5 MG PO TABS: 2.5000 mg | ORAL_TABLET | Freq: Two times a day (BID) | ORAL | 12 refills | Status: DC

## 2016-04-22 ENCOUNTER — Other Ambulatory Visit: Payer: Self-pay | Admitting: Nurse Practitioner

## 2016-04-22 DIAGNOSIS — I825Z2 Chronic embolism and thrombosis of unspecified deep veins of left distal lower extremity: Secondary | ICD-10-CM

## 2016-04-22 MED ORDER — APIXABAN 2.5 MG PO TABS: 2.5000 mg | ORAL_TABLET | Freq: Two times a day (BID) | ORAL | 12 refills | Status: DC

## 2016-07-16 ENCOUNTER — Other Ambulatory Visit: Payer: 59

## 2016-07-16 ENCOUNTER — Ambulatory Visit: Payer: 59 | Admitting: Hematology & Oncology

## 2016-07-21 ENCOUNTER — Other Ambulatory Visit (HOSPITAL_BASED_OUTPATIENT_CLINIC_OR_DEPARTMENT_OTHER): Payer: 59

## 2016-07-21 ENCOUNTER — Ambulatory Visit (HOSPITAL_BASED_OUTPATIENT_CLINIC_OR_DEPARTMENT_OTHER): Payer: 59 | Admitting: Hematology & Oncology

## 2016-07-21 VITALS — BP 133/82 | HR 81 | Temp 98.0°F | Resp 20 | Wt 195.8 lb

## 2016-07-21 DIAGNOSIS — I824Z1 Acute embolism and thrombosis of unspecified deep veins of right distal lower extremity: Secondary | ICD-10-CM

## 2016-07-21 DIAGNOSIS — I825Z1 Chronic embolism and thrombosis of unspecified deep veins of right distal lower extremity: Secondary | ICD-10-CM

## 2016-07-21 DIAGNOSIS — I824Z2 Acute embolism and thrombosis of unspecified deep veins of left distal lower extremity: Secondary | ICD-10-CM | POA: Diagnosis not present

## 2016-07-21 DIAGNOSIS — I825Z2 Chronic embolism and thrombosis of unspecified deep veins of left distal lower extremity: Secondary | ICD-10-CM

## 2016-07-21 LAB — CBC WITH DIFFERENTIAL (CANCER CENTER ONLY)
BASO#: 0 10*3/uL (ref 0.0–0.2)
BASO%: 0.6 % (ref 0.0–2.0)
EOS ABS: 0.1 10*3/uL (ref 0.0–0.5)
EOS%: 1.7 % (ref 0.0–7.0)
HCT: 40.5 % (ref 34.8–46.6)
HGB: 13.2 g/dL (ref 11.6–15.9)
LYMPH#: 2.4 10*3/uL (ref 0.9–3.3)
LYMPH%: 45.5 % (ref 14.0–48.0)
MCH: 30.8 pg (ref 26.0–34.0)
MCHC: 32.6 g/dL (ref 32.0–36.0)
MCV: 95 fL (ref 81–101)
MONO#: 0.4 10*3/uL (ref 0.1–0.9)
MONO%: 8.5 % (ref 0.0–13.0)
NEUT#: 2.3 10*3/uL (ref 1.5–6.5)
NEUT%: 43.7 % (ref 39.6–80.0)
PLATELETS: 245 10*3/uL (ref 145–400)
RBC: 4.28 10*6/uL (ref 3.70–5.32)
RDW: 13.6 % (ref 11.1–15.7)
WBC: 5.2 10*3/uL (ref 3.9–10.0)

## 2016-07-21 NOTE — Progress Notes (Signed)
Hematology and Oncology Follow Up Visit  Hannah Day [id number] 07/07/1961 54 y.o. 07/21/2016   Principle Diagnosis:   DVT of the left lower leg  Lupus anticoagulant positive  Current Therapy:    Eliquis 2.5 mg by mouth BID  Aspirin 81 mg by mouth daily     Interim History:  Hannah Day is back for follow-up. Unfortunately, her insurance would not cover for Xarelto. As such, we now have her on ELIQUIS. She is doing well on the low-dose ELIQUIS. Her left leg feels good. She has occasional leg swelling when she is on her feet all day long.   [place]. She fell. She had a bruise around her right eye. She had no visual difficulties. This seems to be healing up.  She's had no bleeding otherwise. There's been no nausea or vomiting. She's had no cough or shortness of breath.. If she is due for a mammogram next month.    Overall, her performance status is ECOG 1.  Medications:  Current Outpatient Prescriptions:  .  acetaminophen (TYLENOL) 500 MG tablet, Take 500 mg by mouth every 6 (six) hours as needed., Disp: , Rfl:  .  apixaban (ELIQUIS) 2.5 MG TABS tablet, Take 1 tablet (2.5 mg total) by mouth 2 (two) times daily., Disp: 60 tablet, Rfl: 12 .  aspirin 81 MG chewable tablet, Chew 81 mg by mouth daily., Disp: , Rfl:  .  Black Cohosh 40 MG CAPS, Take 1 capsule by mouth 2 (two) times daily., Disp: , Rfl:  .  cetirizine (ZYRTEC) 10 MG tablet, Take 10 mg by mouth as needed for allergies., Disp: , Rfl:  .  dextromethorphan-guaiFENesin (MUCINEX DM) 30-600 MG 12hr tablet, Take 1 tablet by mouth as needed for cough., Disp: , Rfl:  .  doxycycline (ORACEA) 40 MG capsule, Take 40 mg by mouth every morning.  , Disp: , Rfl:   Allergies:  Allergies  Allergen Reactions  . Sulfa Antibiotics Hives    Past Medical History, Surgical history, Social history, and Family History were reviewed and updated.  Review of Systems: As above  Physical Exam:  weight is  195 lb 12.8 oz (88.8 kg). Her oral temperature is 98 F (36.7 C). Her blood pressure is 133/82 and her pulse is 81. Her respiration is 20.   Wt Readings from Last 3 Encounters:  07/21/16 195 lb 12.8 oz (88.8 kg)  03/12/16 196 lb (88.9 kg)  12/26/15 198 lb (89.8 kg)     Well-developed and well-nourished white female in no obvious distress. Head and neck exam shows no ocular or oral lesions. She has a little bit of ecchymoses under the right eye.  There are no palpable cervical or supraclavicular lymph nodes. Lungs are clear bilaterally. Cardiac exam regular rate and rhythm with no murmurs, rubs or bruits. Abdomen is soft. She has good bowel sounds. There is no fluid wave. There is no palpable liver or spleen tip. Active exam shows no tenderness over the spine, ribs or hips. Extremity shows mild nonpitting edema of the right lower leg. No venous cord is noted. She has good range of motion of her joints. She has good pulses in her distal extremities. Skin exam shows no rashes, ecchymoses or petechia. Neurological exam shows no focal neurological deficits.  Lab Results  Component Value Date   WBC 5.2 07/21/2016   HGB 13.2 07/21/2016   HCT 40.5 07/21/2016   MCV 95 07/21/2016   PLT 245 07/21/2016     Chemistry  Component Value Date/Time   NA 141 03/12/2016 0858   K 4.0 03/12/2016 0858   CL 104 10/29/2015 1449   CO2 27 03/12/2016 0858   BUN 14.6 03/12/2016 0858   CREATININE 0.8 03/12/2016 0858      Component Value Date/Time   CALCIUM 9.5 03/12/2016 0858   ALKPHOS 92 03/12/2016 0858   AST 17 03/12/2016 0858   ALT 15 03/12/2016 0858   BILITOT 0.39 03/12/2016 0858         Impression and Plan: Hannah Day is a 55 year old white female. She has a thrombus in the Left ower leg. This has resolved. She does has a superficial thrombus now.  I will keep her on the low-dose ELIQUIS.  Only see her back in 6 months, I will see us she is doing. We will get another Doppler. I suppose  that she may always have the thrombus in the saphenous vein. This is a superficial thrombus.   How long to keep her on low-dose ELIQUIS is unclear. She will finish up a year of low-dose ELIQUIS I think probably in July or August 2018.   She is on low-dose aspirin.   she is positive for the lupus anticoagulant. I am rechecking this today.   I spent about 25 minutes with her.   Josph MachoENNEVER,Marik Sedore R, MD 11/20/201710:57 AM

## 2016-07-23 LAB — LUPUS ANTICOAGULANT PANEL
DRVVT: 48.6 s — AB (ref 0.0–47.0)
PTT-LA: 33.9 s (ref 0.0–51.9)
dRVVT Mix: 41.8 s (ref 0.0–47.0)

## 2017-01-21 ENCOUNTER — Ambulatory Visit (HOSPITAL_BASED_OUTPATIENT_CLINIC_OR_DEPARTMENT_OTHER)
Admission: RE | Admit: 2017-01-21 | Discharge: 2017-01-21 | Disposition: A | Discharge status: Home / Self Care | Payer: 59 | Source: Ambulatory Visit | Attending: Hematology & Oncology | Admitting: Hematology & Oncology

## 2017-01-21 ENCOUNTER — Ambulatory Visit (HOSPITAL_BASED_OUTPATIENT_CLINIC_OR_DEPARTMENT_OTHER): Payer: 59 | Admitting: Hematology & Oncology

## 2017-01-21 ENCOUNTER — Other Ambulatory Visit (HOSPITAL_BASED_OUTPATIENT_CLINIC_OR_DEPARTMENT_OTHER): Payer: 59

## 2017-01-21 VITALS — BP 140/74 | HR 67 | Temp 98.5°F | Resp 16 | Wt 198.8 lb

## 2017-01-21 DIAGNOSIS — I825Z3 Chronic embolism and thrombosis of unspecified deep veins of distal lower extremity, bilateral: Secondary | ICD-10-CM | POA: Insufficient documentation

## 2017-01-21 DIAGNOSIS — I825Z2 Chronic embolism and thrombosis of unspecified deep veins of left distal lower extremity: Secondary | ICD-10-CM

## 2017-01-21 DIAGNOSIS — I82409 Acute embolism and thrombosis of unspecified deep veins of unspecified lower extremity: Secondary | ICD-10-CM | POA: Diagnosis not present

## 2017-01-21 DIAGNOSIS — I824Z2 Acute embolism and thrombosis of unspecified deep veins of left distal lower extremity: Secondary | ICD-10-CM | POA: Diagnosis not present

## 2017-01-21 DIAGNOSIS — I825Z1 Chronic embolism and thrombosis of unspecified deep veins of right distal lower extremity: Secondary | ICD-10-CM

## 2017-01-21 LAB — CBC WITH DIFFERENTIAL (CANCER CENTER ONLY)
BASO#: 0 10*3/uL (ref 0.0–0.2)
BASO%: 0.5 % (ref 0.0–2.0)
EOS%: 2.3 % (ref 0.0–7.0)
Eosinophils Absolute: 0.1 10*3/uL (ref 0.0–0.5)
HCT: 40.4 % (ref 34.8–46.6)
HGB: 13 g/dL (ref 11.6–15.9)
LYMPH#: 2.5 10*3/uL (ref 0.9–3.3)
LYMPH%: 41.3 % (ref 14.0–48.0)
MCH: 30.7 pg (ref 26.0–34.0)
MCHC: 32.2 g/dL (ref 32.0–36.0)
MCV: 96 fL (ref 81–101)
MONO#: 0.4 10*3/uL (ref 0.1–0.9)
MONO%: 7.2 % (ref 0.0–13.0)
NEUT#: 2.9 10*3/uL (ref 1.5–6.5)
NEUT%: 48.7 % (ref 39.6–80.0)
PLATELETS: 216 10*3/uL (ref 145–400)
RBC: 4.23 10*6/uL (ref 3.70–5.32)
RDW: 13.2 % (ref 11.1–15.7)
WBC: 6 10*3/uL (ref 3.9–10.0)

## 2017-01-21 NOTE — Progress Notes (Signed)
Hematology and Oncology Follow Up Visit  Hannah Day [id number] 02/05/1961 56 y.o. 01/21/2017   Principle Diagnosis:   DVT of the left lower leg  Lupus anticoagulant positive  Current Therapy:    Eliquis 2.5 mg by mouth BID - to complete therapy in July 2018  Aspirin 325 mg by mouth daily - start in July 2018     Interim History:  Ms. Hannah Day is back for follow-up.  Embolic disease or thrombophlebitis.  She just got back from [place]. She travels quite a bit for work. She does wear compression stockings. She does have a lupus anticoagulant. She is on 81 mg. I will have her switch over to full dose aspirin after she completes her ELIQUIS.   There is no bleeding. She's had no change in bowel or bladder habits. She's had no cough.   She does have some mild leg swelling at about the ankles.   There is no fever. She's had no rashes. She's had no headache.   Overall, her performance status is ECOG 1.  Medications:  Current Outpatient Prescriptions:  .  acetaminophen (TYLENOL) 500 MG tablet, Take 500 mg by mouth every 6 (six) hours as needed., Disp: , Rfl:  .  apixaban (ELIQUIS) 2.5 MG TABS tablet, Take 1 tablet (2.5 mg total) by mouth 2 (two) times daily., Disp: 60 tablet, Rfl: 12 .  aspirin 81 MG chewable tablet, Chew 81 mg by mouth daily., Disp: , Rfl:  .  Black Cohosh 40 MG CAPS, Take 1 capsule by mouth 2 (two) times daily., Disp: , Rfl:  .  cetirizine (ZYRTEC) 10 MG tablet, Take 10 mg by mouth as needed for allergies., Disp: , Rfl:  .  dextromethorphan-guaiFENesin (MUCINEX DM) 30-600 MG 12hr tablet, Take 1 tablet by mouth as needed for cough., Disp: , Rfl:  .  doxycycline (ORACEA) 40 MG capsule, Take 40 mg by mouth every morning.  , Disp: , Rfl:   Allergies:  Allergies  Allergen Reactions  . Sulfa Antibiotics Hives    Past Medical History, Surgical history, Social history, and Family History were reviewed and updated.  Review of Systems: As above  Physical  Exam:  weight is 198 lb 12.8 oz (90.2 kg). Her oral temperature is 98.5 F (36.9 C). Her blood pressure is 140/74 and her pulse is 67. Her respiration is 16 and oxygen saturation is 100%.   Wt Readings from Last 3 Encounters:  01/21/17 198 lb 12.8 oz (90.2 kg)  07/21/16 195 lb 12.8 oz (88.8 kg)  03/12/16 196 lb (88.9 kg)     Well-developed and well-nourished white female in no obvious distress. Head and neck exam shows no ocular or oral lesions. She has a little bit of ecchymoses under the right eye.  There are no palpable cervical or supraclavicular lymph nodes. Lungs are clear bilaterally. Cardiac exam regular rate and rhythm with no murmurs, rubs or bruits. Abdomen is soft. She has good bowel sounds. There is no fluid wave. There is no palpable liver or spleen tip. Active exam shows no tenderness over the spine, ribs or hips. Extremity shows mild nonpitting edema of the right lower leg. No venous cord is noted. She has good range of motion of her joints. She has good pulses in her distal extremities. Skin exam shows no rashes, ecchymoses or petechia. Neurological exam shows no focal neurological deficits.  Lab Results  Component Value Date   WBC 6.0 01/21/2017   HGB 13.0 01/21/2017   HCT 40.4 01/21/2017  MCV 96 01/21/2017   PLT 216 01/21/2017     Chemistry      Component Value Date/Time   NA 141 03/12/2016 0858   K 4.0 03/12/2016 0858   CL 104 10/29/2015 1449   CO2 27 03/12/2016 0858   BUN 14.6 03/12/2016 0858   CREATININE 0.8 03/12/2016 0858      Component Value Date/Time   CALCIUM 9.5 03/12/2016 0858   ALKPHOS 92 03/12/2016 0858   AST 17 03/12/2016 0858   ALT 15 03/12/2016 0858   BILITOT 0.39 03/12/2016 0858         Impression and Plan: Ms. Hannah Day is a 56 year old white female. She has a thrombus in the Left ower leg. This has resolved. She does has a superficial thrombus now.  For now, we will keep her on the low-dose ELIQUIS through July. After that, I will  then get her on full dose aspirin and discontinue the ELIQUIS. I told her to take the full dose aspirin in the morning with food so that she is up and about and aspirin will not stay in her stomach.  I will like to see her back in 6 months. If all looks good in 6 months, then we can let her go from the clinic.  Hannah Macho, MD 5/23/201811:13 AM

## 2017-01-22 LAB — D-DIMER, QUANTITATIVE: D-DIMER: 0.3 mg/L FEU (ref 0.00–0.49)

## 2017-01-23 LAB — LUPUS ANTICOAGULANT PANEL
DRVVT: 53.2 s — AB (ref 0.0–47.0)
PTT-LA: 34 s (ref 0.0–51.9)
dRVVT Mix: 43.6 s (ref 0.0–47.0)

## 2017-05-20 ENCOUNTER — Telehealth: Payer: Self-pay | Admitting: *Deleted

## 2017-05-20 ENCOUNTER — Ambulatory Visit (HOSPITAL_BASED_OUTPATIENT_CLINIC_OR_DEPARTMENT_OTHER)
Admission: RE | Admit: 2017-05-20 | Discharge: 2017-05-20 | Disposition: A | Discharge status: Home / Self Care | Payer: 59 | Source: Ambulatory Visit | Attending: Family | Admitting: Family

## 2017-05-20 ENCOUNTER — Other Ambulatory Visit: Payer: Self-pay | Admitting: Family

## 2017-05-20 DIAGNOSIS — I824Z2 Acute embolism and thrombosis of unspecified deep veins of left distal lower extremity: Secondary | ICD-10-CM | POA: Insufficient documentation

## 2017-05-20 DIAGNOSIS — M79604 Pain in right leg: Secondary | ICD-10-CM

## 2017-05-20 NOTE — Telephone Encounter (Signed)
Patient is c/o pain in her RIGHT leg. She has a history of DVT to her left leg, but was recently taken off her blood thinner and is just on aspirin. She is concerned that she may now have a DVT in her right leg.  Spoke with Emeline Gins NP and she has place an order for a doppler.   Patient aware of order. Phone number given to her to call and schedule exam. She states she will call ASAP.  Will follow up with patient regarding results.

## 2017-07-20 ENCOUNTER — Ambulatory Visit (HOSPITAL_BASED_OUTPATIENT_CLINIC_OR_DEPARTMENT_OTHER): Payer: 59 | Admitting: Hematology & Oncology

## 2017-07-20 ENCOUNTER — Other Ambulatory Visit: Payer: Self-pay

## 2017-07-20 ENCOUNTER — Other Ambulatory Visit (HOSPITAL_BASED_OUTPATIENT_CLINIC_OR_DEPARTMENT_OTHER): Payer: 59

## 2017-07-20 DIAGNOSIS — I824Z2 Acute embolism and thrombosis of unspecified deep veins of left distal lower extremity: Secondary | ICD-10-CM | POA: Diagnosis not present

## 2017-07-20 DIAGNOSIS — Z7901 Long term (current) use of anticoagulants: Secondary | ICD-10-CM

## 2017-07-20 DIAGNOSIS — I825Z2 Chronic embolism and thrombosis of unspecified deep veins of left distal lower extremity: Secondary | ICD-10-CM

## 2017-07-20 LAB — CBC WITH DIFFERENTIAL (CANCER CENTER ONLY)
BASO#: 0 10*3/uL (ref 0.0–0.2)
BASO%: 0.6 % (ref 0.0–2.0)
EOS ABS: 0.1 10*3/uL (ref 0.0–0.5)
EOS%: 2.5 % (ref 0.0–7.0)
HCT: 38.8 % (ref 34.8–46.6)
HGB: 12.5 g/dL (ref 11.6–15.9)
LYMPH#: 2.5 10*3/uL (ref 0.9–3.3)
LYMPH%: 48.5 % — AB (ref 14.0–48.0)
MCH: 30.9 pg (ref 26.0–34.0)
MCHC: 32.2 g/dL (ref 32.0–36.0)
MCV: 96 fL (ref 81–101)
MONO#: 0.5 10*3/uL (ref 0.1–0.9)
MONO%: 9 % (ref 0.0–13.0)
NEUT#: 2 10*3/uL (ref 1.5–6.5)
NEUT%: 39.4 % — AB (ref 39.6–80.0)
PLATELETS: 208 10*3/uL (ref 145–400)
RBC: 4.04 10*6/uL (ref 3.70–5.32)
RDW: 13.6 % (ref 11.1–15.7)
WBC: 5.1 10*3/uL (ref 3.9–10.0)

## 2017-07-20 LAB — CMP (CANCER CENTER ONLY)
ALT(SGPT): 33 U/L (ref 10–47)
AST: 36 U/L (ref 11–38)
Albumin: 3.3 g/dL (ref 3.3–5.5)
Alkaline Phosphatase: 77 U/L (ref 26–84)
BUN: 10 mg/dL (ref 7–22)
CHLORIDE: 107 meq/L (ref 98–108)
CO2: 27 meq/L (ref 18–33)
CREATININE: 0.6 mg/dL (ref 0.6–1.2)
Calcium: 9.1 mg/dL (ref 8.0–10.3)
GLUCOSE: 90 mg/dL (ref 73–118)
POTASSIUM: 4.2 meq/L (ref 3.3–4.7)
SODIUM: 145 meq/L (ref 128–145)
TOTAL PROTEIN: 6.5 g/dL (ref 6.4–8.1)
Total Bilirubin: 0.5 mg/dl (ref 0.20–1.60)

## 2017-07-20 MED ORDER — ASPIRIN 81 MG PO TABS
81.0000 mg | ORAL_TABLET | Freq: Every day | ORAL | 3 refills | Status: AC
Start: 2017-07-20 — End: ?

## 2017-07-20 MED ORDER — APIXABAN 2.5 MG PO TABS: 2.5000 mg | ORAL_TABLET | Freq: Two times a day (BID) | ORAL | 12 refills | Status: DC

## 2017-07-20 NOTE — Progress Notes (Signed)
Hematology and Oncology Follow Up Visit  Verta Ellenaja G Jubb [id number] 09/24/1960 56 y.o. 07/20/2017   Principle Diagnosis:   DVT of the left lower leg  Lupus anticoagulant positive  Current Therapy:    Eliquis 2.5 mg by mouth BID -restart on 07/20/2017   Aspirin 81 mg by mouth daily - start in July 2018     Interim History:  [name] is back for follow-up.  She is doing pretty well.  [place]  She enjoys this.  She says that now that she is off of Eliquis, she has more pain in her legs.  She actually had a lot of pain in the right leg.  This was back in September.  She had a Doppler done which did not show any thromboembolic disease in the right leg.  She says there is no swelling.  There is no redness.  She says there is just a lot of achiness which can sometimes make it difficult for her to ambulate any significant distance.  She would like to get back on low-dose Eliquis.  I think we could do this for her.  With her the having the lupus anticoagulant, I do think that low-dose aspirin would be helpful for her also.   She has had no bleeding.  She is due for a mammogram.  She has had no cough or shortness of breath.  There is no chest wall pain.  She has had no obvious rashes.  There is been no change in bowel or bladder habits.    Overall, her performance status is ECOG 0.  Medications:  Current Outpatient Medications:  .  acetaminophen (TYLENOL) 500 MG tablet, Take 500 mg by mouth every 6 (six) hours as needed., Disp: , Rfl:  .  aspirin 81 MG tablet, Take 1 tablet (81 mg total) daily by mouth., Disp: , Rfl: 3 .  cetirizine (ZYRTEC) 10 MG tablet, Take 10 mg by mouth as needed for allergies., Disp: , Rfl:  .  dextromethorphan-guaiFENesin (MUCINEX DM) 30-600 MG 12hr tablet, Take 1 tablet by mouth as needed for cough., Disp: , Rfl:  .  doxycycline (ORACEA) 40 MG capsule, Take 40 mg by mouth every morning.  , Disp: , Rfl:  .  NON  FORMULARY, Ambreen two capsules daily, Disp: , Rfl:  .  apixaban (ELIQUIS) 2.5 MG TABS tablet, Take 1 tablet (2.5 mg total) by mouth 2 (two) times daily. (Patient not taking: Reported on 07/20/2017), Disp: 60 tablet, Rfl: 12 .  apixaban (ELIQUIS) 2.5 MG TABS tablet, Take 1 tablet (2.5 mg total) 2 (two) times daily by mouth., Disp: 60 tablet, Rfl: 12  Allergies:  Allergies  Allergen Reactions  . Sulfa Antibiotics Hives    Past Medical History, Surgical history, Social history, and Family History were reviewed and updated.  Review of Systems: As stated in the interim history  Physical Exam:  vitals were not taken for this visit.   Wt Readings from Last 3 Encounters:  01/21/17 198 lb 12.8 oz (90.2 kg)  07/21/16 195 lb 12.8 oz (88.8 kg)  03/12/16 196 lb (88.9 kg)     I examined Ms. Edelstein.  My findings on the examination are noted below:   Well-developed and well-nourished white female in no obvious distress. Head and neck exam shows no ocular or oral lesions. She has a little bit of ecchymoses under the right eye.  There are no palpable cervical or supraclavicular lymph nodes. Lungs are clear  bilaterally. Cardiac exam regular rate and rhythm with no murmurs, rubs or bruits. Abdomen is soft. She has good bowel sounds. There is no fluid wave. There is no palpable liver or spleen tip.  Back exam shows no tenderness over the spine, ribs or hips. Extremities shows mild nonpitting edema of the right lower leg. No venous cord is noted in either leg. She has good range of motion of her joints.  She has a negative Homans sign bilaterally.  She has good pulses in her distal extremities. Skin exam shows no rashes, ecchymoses or petechia. Neurological exam shows no focal neurological deficits.  Lab Results  Component Value Date   WBC 5.1 07/20/2017   HGB 12.5 07/20/2017   HCT 38.8 07/20/2017   MCV 96 07/20/2017   PLT 208 07/20/2017     Chemistry      Component Value Date/Time   NA 145  07/20/2017 0926   NA 141 03/12/2016 0858   K 4.2 07/20/2017 0926   K 4.0 03/12/2016 0858   CL 107 07/20/2017 0926   CO2 27 07/20/2017 0926   CO2 27 03/12/2016 0858   BUN 10 07/20/2017 0926   BUN 14.6 03/12/2016 0858   CREATININE 0.6 07/20/2017 0926   CREATININE 0.8 03/12/2016 0858      Component Value Date/Time   CALCIUM 9.1 07/20/2017 0926   CALCIUM 9.5 03/12/2016 0858   ALKPHOS 77 07/20/2017 0926   ALKPHOS 92 03/12/2016 0858   AST 36 07/20/2017 0926   AST 17 03/12/2016 0858   ALT 33 07/20/2017 0926   ALT 15 03/12/2016 0858   BILITOT 0.50 07/20/2017 0926   BILITOT 0.39 03/12/2016 0858         Impression and Plan: [name] is a 56 year old white female. She has a thrombus in the Left ower leg. This has resolved. She does not have a superficial thrombus but her last Doppler back in May 2018.  Mentally, she feels much more confident being on low-dose Eliquis.  She says that when she is off the Eliquis, and on aspirin, she has pain in her left leg.  She had a Doppler done of her right leg back in September which was unremarkable.  I do not see an issue with getting her back on low-dose Eliquis.  A 2.5 mg twice a day dosing, adding the risk of bleeding is going to be less than 5%.  I think this is something that is incredibly important for us to remember.  Her quality of life is better on Eliquis.  With her traveling, I think Eliquis might be more reasonable.  Because that she has had a lupus anticoagulant, I think we have to have her on 81 mg dose of aspirin daily.    I would like to see her back in 3-4 months.  We will get a Doppler of her left leg when we see her back to see how everything looks.    Josph MachoPeter R Ennever, MD 11/19/201811:32 AM

## 2017-07-21 LAB — D-DIMER, QUANTITATIVE: D-DIMER: 1.03 mg/L FEU — ABNORMAL HIGH (ref 0.00–0.49)

## 2017-08-11 IMAGING — US US EXTREM LOW VENOUS*L*
1 series · 14 of 24 positions shown · non-contrast
Comparison: 01/27/2006, CONTRALATERAL

CLINICAL DATA: Left ankle swelling and pain x5 days. Previous
superficial thrombophlebitis.

EXAM:
LEFT LOWER EXTREMITY VENOUS DOPPLER ULTRASOUND
TECHNIQUE: Gray-scale sonography with compression, as well as color and duplex
ultrasound, were performed to evaluate the deep venous system from
the level of the common femoral vein through the popliteal and
proximal calf veins.

[Series 1: us extrem low venous*left* · 0.08mm/px · 14 of 40 slices shown]
[im 1/40]
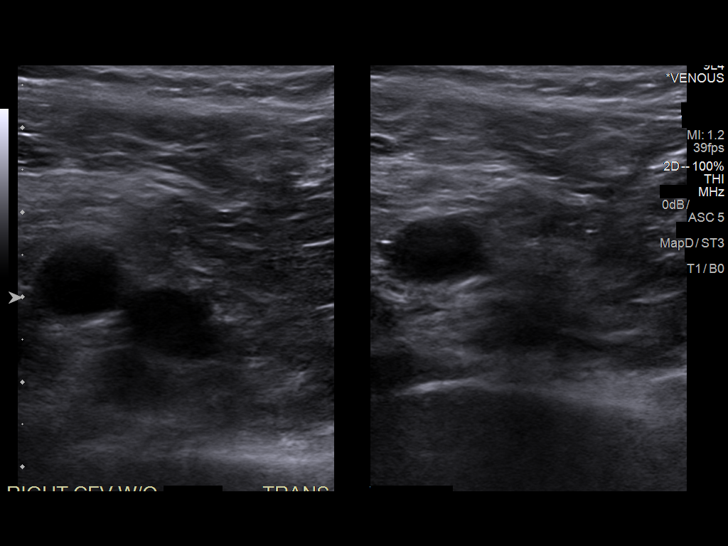
[im 4/40]
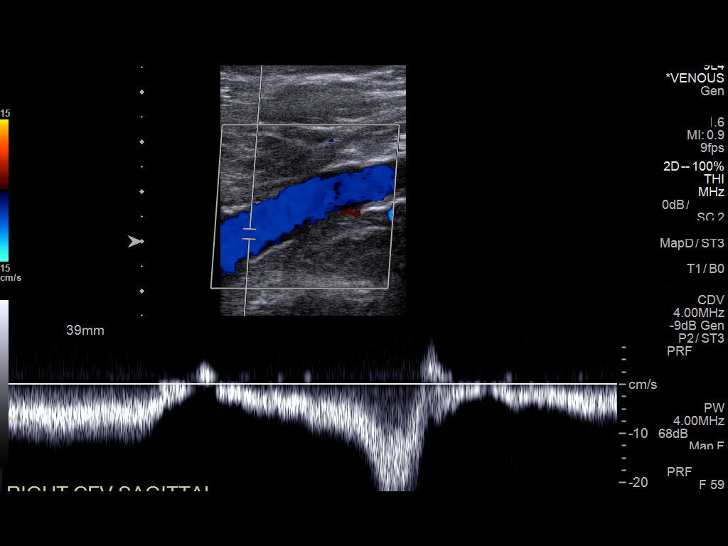
[im 7/40]
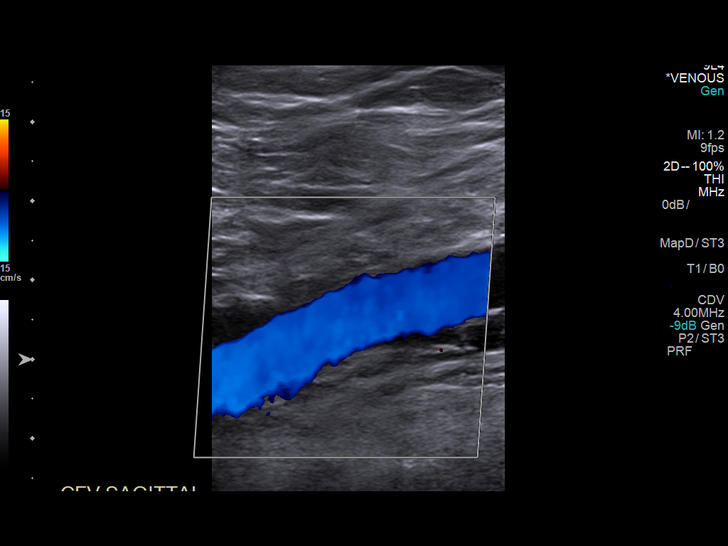
[im 11/40]
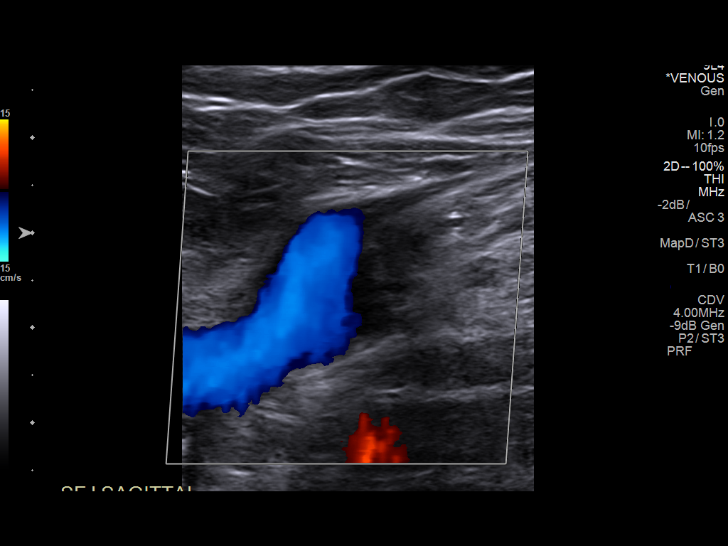
[im 12/40]
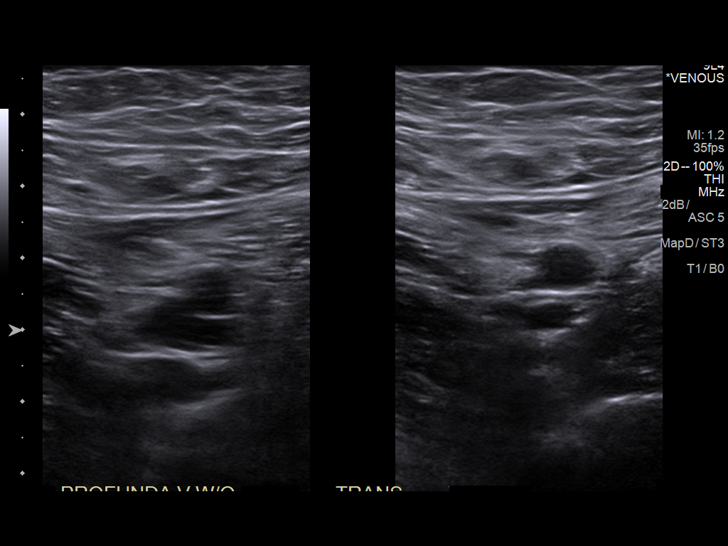
[im 16/40]
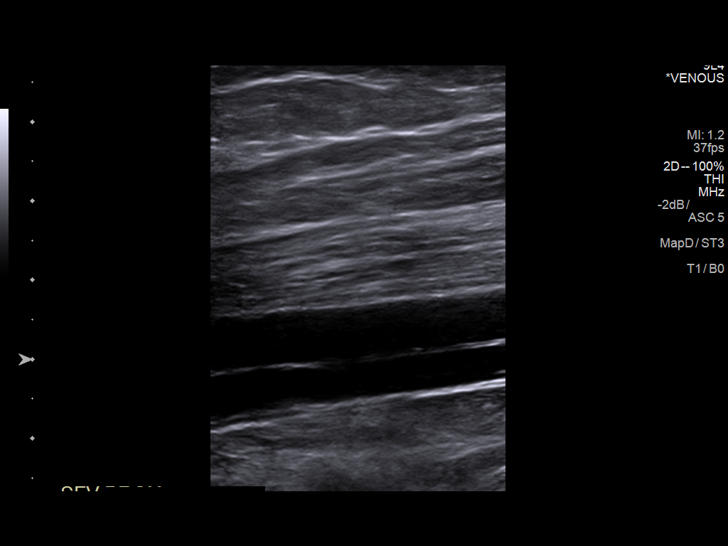
[im 19/40]
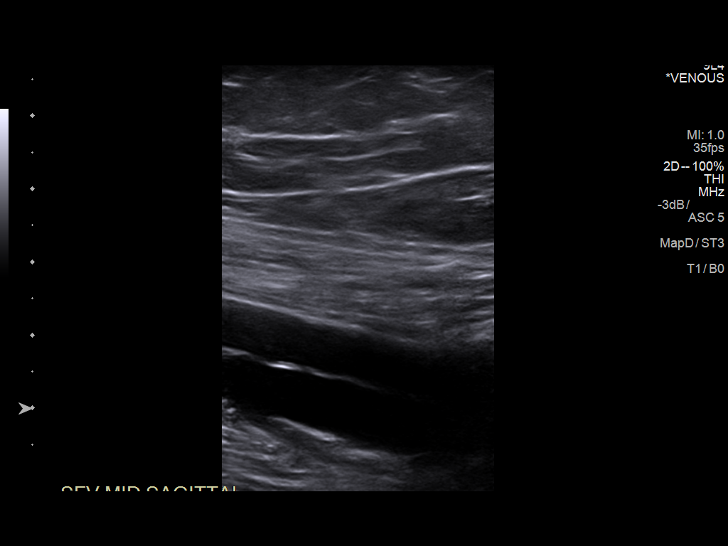
[im 21/40]
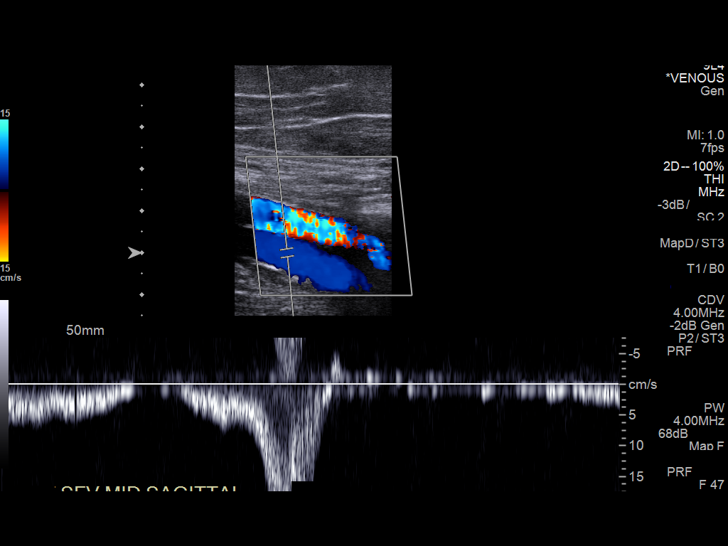
[im 24/40]
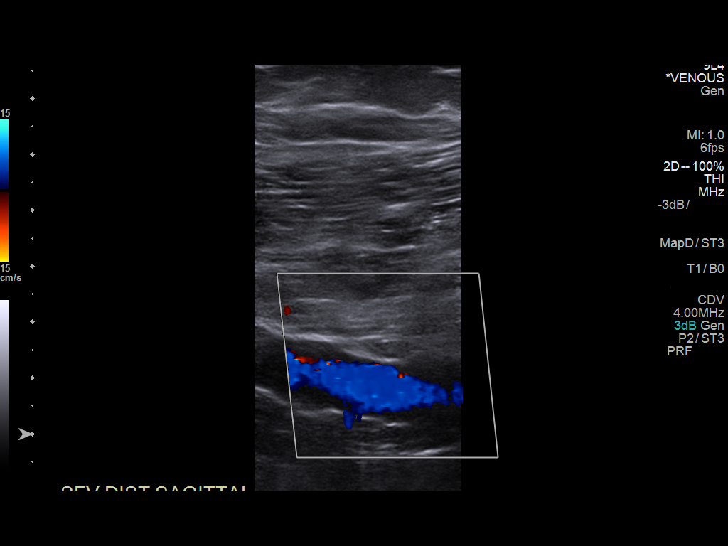
[im 28/40]
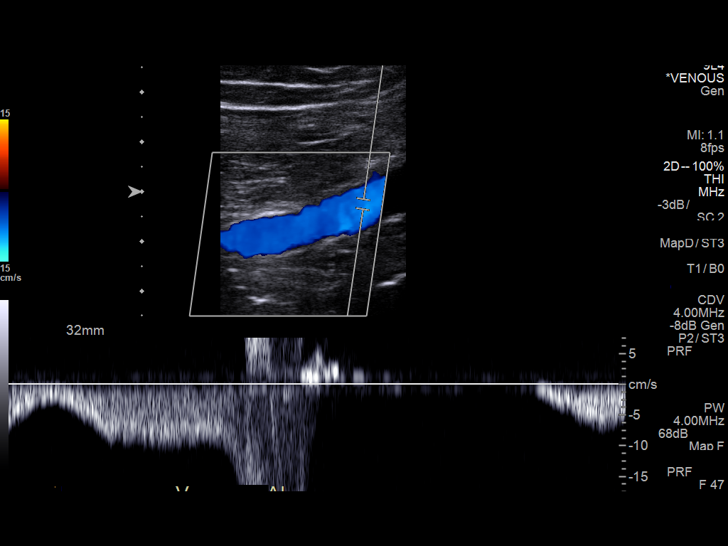
[im 31/40]
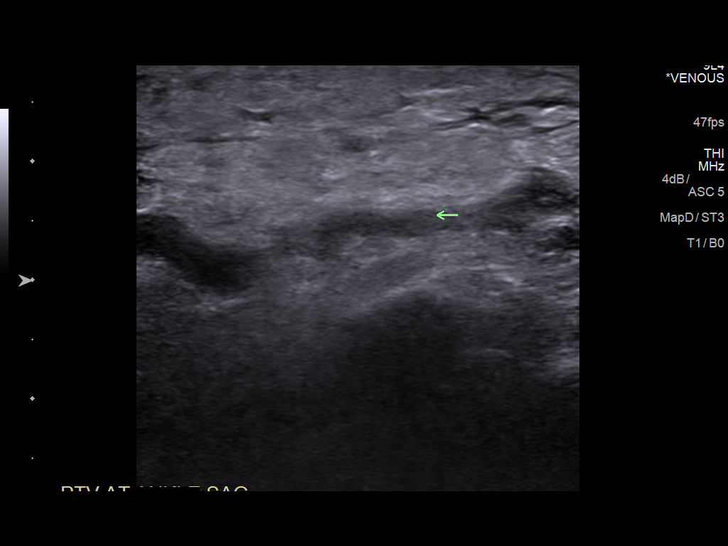
[im 33/40]
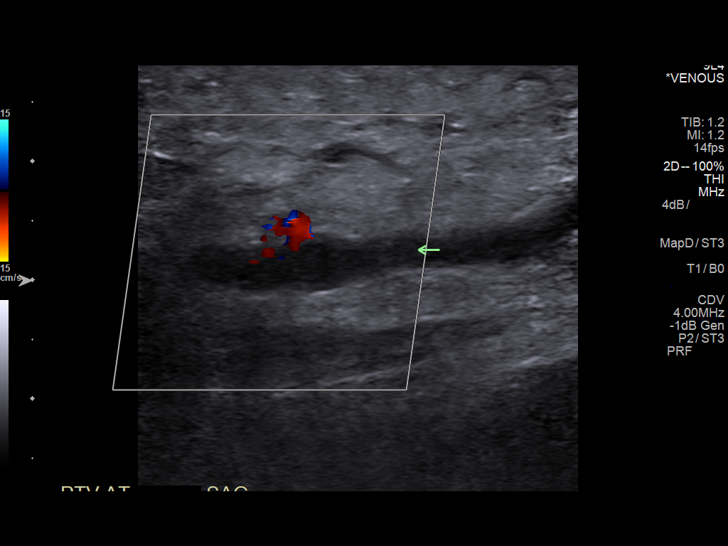
[im 36/40]
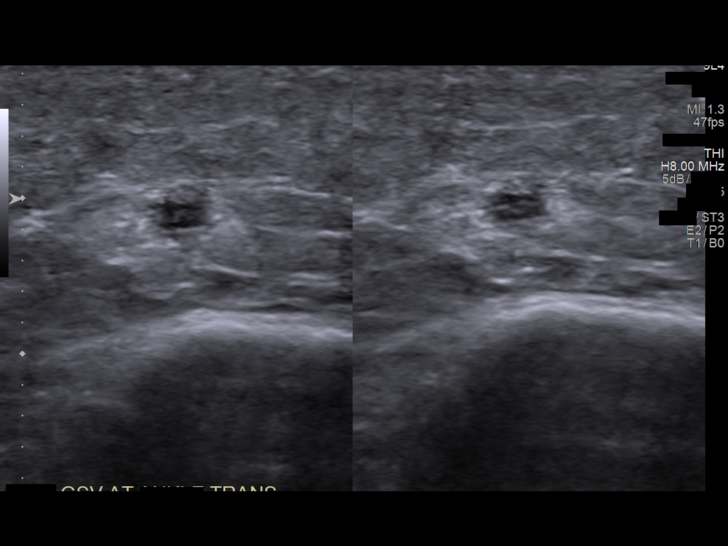
[im 40/40]
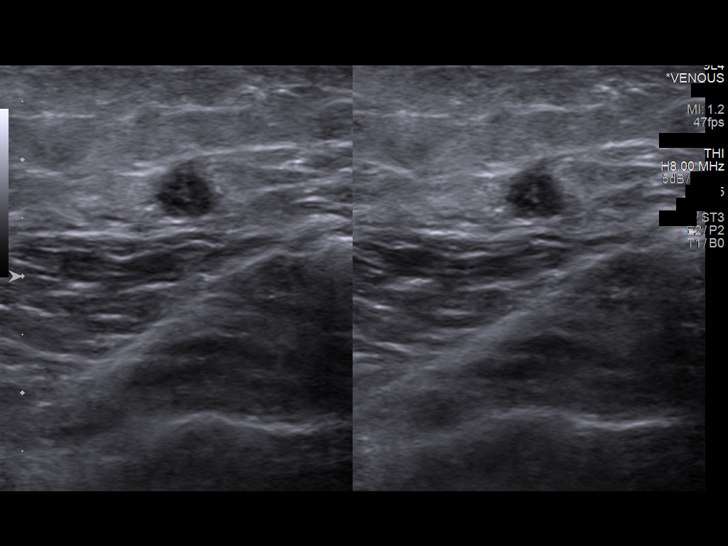

[14 of 24 positions shown; findings below may reference images not displayed]

FINDINGS: Normal compressibility of the common femoral, superficial femoral,
and popliteal veins. there is a noncompressible posterior tibial
vein which shows no color signal on doppler interrogation. there is
thrombosis of the greater saphenous vein from the ankle to the mid
calf level.
IMPRESSION: 1. Isolated left posterior tibial (calf) DVT without proximal
extension.
2. Superficial thrombophlebitis involving the greater saphenous vein
in the distal calf.

## 2017-10-05 ENCOUNTER — Inpatient Hospital Stay: Payer: 59

## 2017-10-05 ENCOUNTER — Encounter: Payer: Self-pay | Admitting: Hematology & Oncology

## 2017-10-05 ENCOUNTER — Inpatient Hospital Stay: Payer: 59 | Attending: Hematology & Oncology | Admitting: Hematology & Oncology

## 2017-10-05 ENCOUNTER — Ambulatory Visit (HOSPITAL_BASED_OUTPATIENT_CLINIC_OR_DEPARTMENT_OTHER)
Admission: RE | Admit: 2017-10-05 | Discharge: 2017-10-05 | Disposition: A | Discharge status: Home / Self Care | Payer: 59 | Source: Ambulatory Visit | Attending: Hematology & Oncology | Admitting: Hematology & Oncology

## 2017-10-05 VITALS — BP 139/79 | HR 61 | Temp 98.1°F | Resp 16 | Wt 200.0 lb

## 2017-10-05 DIAGNOSIS — Z86718 Personal history of other venous thrombosis and embolism: Secondary | ICD-10-CM | POA: Insufficient documentation

## 2017-10-05 DIAGNOSIS — I825Z2 Chronic embolism and thrombosis of unspecified deep veins of left distal lower extremity: Secondary | ICD-10-CM | POA: Diagnosis present

## 2017-10-05 DIAGNOSIS — Z7901 Long term (current) use of anticoagulants: Secondary | ICD-10-CM | POA: Insufficient documentation

## 2017-10-05 DIAGNOSIS — Z09 Encounter for follow-up examination after completed treatment for conditions other than malignant neoplasm: Secondary | ICD-10-CM | POA: Diagnosis not present

## 2017-10-05 DIAGNOSIS — I824Z2 Acute embolism and thrombosis of unspecified deep veins of left distal lower extremity: Secondary | ICD-10-CM

## 2017-10-05 DIAGNOSIS — Z79899 Other long term (current) drug therapy: Secondary | ICD-10-CM | POA: Diagnosis not present

## 2017-10-05 DIAGNOSIS — Z7982 Long term (current) use of aspirin: Secondary | ICD-10-CM | POA: Diagnosis not present

## 2017-10-05 DIAGNOSIS — D6862 Lupus anticoagulant syndrome: Secondary | ICD-10-CM

## 2017-10-05 DIAGNOSIS — Z882 Allergy status to sulfonamides status: Secondary | ICD-10-CM | POA: Diagnosis not present

## 2017-10-05 LAB — CBC WITH DIFFERENTIAL (CANCER CENTER ONLY)
Basophils Absolute: 0.1 10*3/uL (ref 0.0–0.1)
Basophils Relative: 1 %
EOS PCT: 2 %
Eosinophils Absolute: 0.1 10*3/uL (ref 0.0–0.5)
HEMATOCRIT: 41.5 % (ref 34.8–46.6)
Hemoglobin: 13.4 g/dL (ref 11.6–15.9)
LYMPHS ABS: 2.8 10*3/uL (ref 0.9–3.3)
LYMPHS PCT: 46 %
MCH: 30.8 pg (ref 26.0–34.0)
MCHC: 32.3 g/dL (ref 32.0–36.0)
MCV: 95.4 fL (ref 81.0–101.0)
MONO ABS: 0.5 10*3/uL (ref 0.1–0.9)
MONOS PCT: 8 %
NEUTROS ABS: 2.6 10*3/uL (ref 1.5–6.5)
Neutrophils Relative %: 43 %
PLATELETS: 227 10*3/uL (ref 145–400)
RBC: 4.35 MIL/uL (ref 3.70–5.32)
RDW: 13 % (ref 11.1–15.7)
WBC Count: 6.1 10*3/uL (ref 3.9–10.0)

## 2017-10-05 LAB — CMP (CANCER CENTER ONLY)
ALK PHOS: 100 U/L (ref 40–150)
ALT: 13 U/L (ref 0–55)
ANION GAP: 7 (ref 3–11)
AST: 19 U/L (ref 5–34)
Albumin: 3.8 g/dL (ref 3.5–5.0)
BUN: 13 mg/dL (ref 7–26)
CALCIUM: 9.8 mg/dL (ref 8.4–10.4)
CO2: 28 mmol/L (ref 22–29)
CREATININE: 0.87 mg/dL (ref 0.60–1.10)
Chloride: 104 mmol/L (ref 98–109)
GFR, Est AFR Am: >60 mL/min (ref >60)
Glucose, Bld: 83 mg/dL (ref 70–140)
Potassium: 4.5 mmol/L (ref 3.5–5.1)
Sodium: 139 mmol/L (ref 136–145)
TOTAL PROTEIN: 7.3 g/dL (ref 6.4–8.3)
Total Bilirubin: 0.4 mg/dL (ref 0.2–1.2)

## 2017-10-05 LAB — D-DIMER, QUANTITATIVE (NOT AT ARMC)

## 2017-10-05 NOTE — Progress Notes (Signed)
Hematology and Oncology Follow Up Visit  Hannah Day [id number] 1961-04-18 57 y.o. 10/05/2017   Principle Diagnosis:   DVT of the left lower leg  Lupus anticoagulant positive  Current Therapy:    Eliquis 2.5 mg by mouth BID -restart on 07/20/2017   Aspirin 81 mg by mouth daily - start in July 2018     Interim History:  [name] is back for follow-up.  She is doing quite well.  Her son is getting married in June.  She is looking forward to this.  As always, she is traveling.  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  She will be going to a show in [place] in February.  She had a Doppler done today.  There is no blood clot that was seen in her left leg.  She is doing well on the low-dose Eliquis and aspirin.  This really helps her legs.  Since she travels quite a bit, I do not think that the Eliquis and aspirin would be a problem for her.  She has had no flu.  She is had no fever.  She had no cough or shortness of breath.  There is been no chest wall pain.     Overall, her performance status is ECOG 0.  Medications:  Current Outpatient Medications:  .  acetaminophen (TYLENOL) 500 MG tablet, Take 500 mg by mouth every 6 (six) hours as needed., Disp: , Rfl:  .  apixaban (ELIQUIS) 2.5 MG TABS tablet, Take 1 tablet (2.5 mg total) by mouth 2 (two) times daily. (Patient not taking: Reported on 07/20/2017), Disp: 60 tablet, Rfl: 12 .  aspirin 81 MG tablet, Take 1 tablet (81 mg total) daily by mouth., Disp: , Rfl: 3 .  cetirizine (ZYRTEC) 10 MG tablet, Take 10 mg by mouth as needed for allergies., Disp: , Rfl:  .  dextromethorphan-guaiFENesin (MUCINEX DM) 30-600 MG 12hr tablet, Take 1 tablet by mouth as needed for cough., Disp: , Rfl:  .  doxycycline (ORACEA) 40 MG capsule, Take 40 mg by mouth every morning.  , Disp: , Rfl:  .  NON FORMULARY, Ambreen two capsules daily, Disp: , Rfl:   Allergies:  Allergies  Allergen Reactions  . Sulfa  Antibiotics Hives    Past Medical History, Surgical history, Social history, and Family History were reviewed and updated.  Review of Systems: Review of Systems  Constitutional: Negative.   HENT: Negative.   Eyes: Negative.   Respiratory: Negative.   Cardiovascular: Negative.   Gastrointestinal: Negative.   Genitourinary: Negative.   Musculoskeletal: Negative.   Skin: Negative.   Neurological: Negative.   Endo/Heme/Allergies: Negative.   Psychiatric/Behavioral: Negative.      Physical Exam:  weight is 200 lb (90.7 kg). Her oral temperature is 98.1 F (36.7 C). Her blood pressure is 139/79 and her pulse is 61. Her respiration is 16 and oxygen saturation is 98%.   Wt Readings from Last 3 Encounters:  10/05/17 200 lb (90.7 kg)  01/21/17 198 lb 12.8 oz (90.2 kg)  07/21/16 195 lb 12.8 oz (88.8 kg)     Physical Exam  Constitutional: She is oriented to person, place, and time.  HENT:  Head: Normocephalic and atraumatic.  Mouth/Throat: Oropharynx is clear and moist.  Eyes: EOM are normal. Pupils are equal, round, and reactive to light.  Neck: Normal range of motion.  Cardiovascular: Normal rate, regular rhythm and normal heart sounds.  Pulmonary/Chest: Effort normal and breath  sounds normal.  Abdominal: Soft. Bowel sounds are normal.  Musculoskeletal: Normal range of motion. She exhibits no edema, tenderness or deformity.  Lymphadenopathy:    She has no cervical adenopathy.  Neurological: She is alert and oriented to person, place, and time.  Skin: Skin is warm and dry. No rash noted. No erythema.  Psychiatric: She has a normal mood and affect. Her behavior is normal. Judgment and thought content normal.  Vitals reviewed.   Lab Results  Component Value Date   WBC 6.1 10/05/2017   HGB 12.5 07/20/2017   HCT 41.5 10/05/2017   MCV 95.4 10/05/2017   PLT 227 10/05/2017     Chemistry      Component Value Date/Time   NA 145 07/20/2017 0926   NA 141 03/12/2016 0858   K  4.2 07/20/2017 0926   K 4.0 03/12/2016 0858   CL 107 07/20/2017 0926   CO2 27 07/20/2017 0926   CO2 27 03/12/2016 0858   BUN 10 07/20/2017 0926   BUN 14.6 03/12/2016 0858   CREATININE 0.6 07/20/2017 0926   CREATININE 0.8 03/12/2016 0858      Component Value Date/Time   CALCIUM 9.1 07/20/2017 0926   CALCIUM 9.5 03/12/2016 0858   ALKPHOS 77 07/20/2017 0926   ALKPHOS 92 03/12/2016 0858   AST 36 07/20/2017 0926   AST 17 03/12/2016 0858   ALT 33 07/20/2017 0926   ALT 15 03/12/2016 0858   BILITOT 0.50 07/20/2017 0926   BILITOT 0.39 03/12/2016 0858         Impression and Plan: Ms. Hannah Day is a 57 year old white female. She has a thrombus in the Left lower leg. This has resolved.  For right now, we will keep her on the Eliquis and aspirin.  I do not see that we have to do any Dopplers on her in the future unless she has new symptoms.  We will go ahead and get her back in 6 months.  She is happy with this.    She told me that she would show us pictures of her son's wedding.    Josph MachoPeter R Yevette Knust, MD 2/4/201911:41 AM

## 2017-10-07 LAB — DRVVT MIX: DRVVT MIX: 51.1 s — AB (ref 0.0–47.0)

## 2017-10-07 LAB — LUPUS ANTICOAGULANT PANEL
DRVVT: 71.6 s — ABNORMAL HIGH (ref 0.0–47.0)
PTT Lupus Anticoagulant: 37.5 s (ref 0.0–51.9)

## 2017-10-07 LAB — DRVVT CONFIRM: dRVVT Confirm: 1.5 ratio — ABNORMAL HIGH (ref 0.8–1.2)

## 2018-01-06 ENCOUNTER — Encounter: Payer: Self-pay | Admitting: Family Medicine

## 2018-01-06 ENCOUNTER — Other Ambulatory Visit: Payer: Self-pay

## 2018-01-06 ENCOUNTER — Ambulatory Visit (INDEPENDENT_AMBULATORY_CARE_PROVIDER_SITE_OTHER): Payer: 59 | Admitting: Family Medicine

## 2018-01-06 VITALS — BP 124/82 | HR 69 | Temp 98.8°F | Resp 16 | Ht 69.0 in | Wt 200.2 lb

## 2018-01-06 DIAGNOSIS — Z23 Encounter for immunization: Secondary | ICD-10-CM

## 2018-01-06 DIAGNOSIS — L03119 Cellulitis of unspecified part of limb: Secondary | ICD-10-CM | POA: Diagnosis not present

## 2018-01-06 MED ORDER — CEPHALEXIN 500 MG PO CAPS: 500.0000 mg | ORAL_CAPSULE | Freq: Three times a day (TID) | ORAL | 0 refills | Status: AC

## 2018-01-06 NOTE — Addendum Note (Signed)
Addended by: Geannie Risen on: 01/06/2018 09:18 AM   Modules accepted: Orders

## 2018-01-06 NOTE — Patient Instructions (Addendum)
Follow up as needed or as scheduled START the Cephalexin 3x/day- with food- for the infection ICE!  Elevate!  This will help the swelling If the redness worsens or rapidly spreads- let me know! Keep area clean and covered Call with any questions or concerns Hang in there!!!

## 2018-01-06 NOTE — Progress Notes (Signed)
   Subjective:    Patient ID: Hannah Day, female    DOB: 1961/07/14, 57 y.o.   MRN: [id number]  HPI Cellulitis- L foot.  Pt was moving a table last week and the base scraped the top of her foot.  Initially was a scrape but now the area is swollen, hard, red.  Area is painful to touch.  'not very warm'.   Review of Systems For ROS see HPI     Objective:   Physical Exam  Constitutional: She is oriented to person, place, and time. She appears well-developed and well-nourished. No distress.  Cardiovascular: Intact distal pulses.  Musculoskeletal: She exhibits edema (1+ pitting edema of dorsum of L foot).  No TTP over metatarsals or toes to suggest bony involvement.  Neurological: She is alert and oriented to person, place, and time.  Skin: Skin is warm and dry. There is erythema (mild erythema surrounding central scrape on dorsum of foot, TTP).  Vitals reviewed.         Assessment & Plan:  Cellulitis- new.  Pt's foot is red and swollen after scraping the top of it while moving a table.  No evidence of bony involvement to suggest osteo.  Given redness, TTP, and swelling will start Keflex.  Reviewed supportive care and red flags that should prompt return.  Pt expressed understanding and is in agreement w/ plan.

## 2018-04-05 ENCOUNTER — Inpatient Hospital Stay: Payer: 59

## 2018-04-05 ENCOUNTER — Encounter: Payer: Self-pay | Admitting: Hematology & Oncology

## 2018-04-05 ENCOUNTER — Other Ambulatory Visit: Payer: Self-pay

## 2018-04-05 ENCOUNTER — Inpatient Hospital Stay: Payer: 59 | Attending: Hematology & Oncology | Admitting: Hematology & Oncology

## 2018-04-05 VITALS — BP 130/76 | HR 66 | Temp 98.3°F | Resp 16 | Wt 202.0 lb

## 2018-04-05 DIAGNOSIS — Z7901 Long term (current) use of anticoagulants: Secondary | ICD-10-CM | POA: Diagnosis not present

## 2018-04-05 DIAGNOSIS — Z86718 Personal history of other venous thrombosis and embolism: Secondary | ICD-10-CM | POA: Insufficient documentation

## 2018-04-05 DIAGNOSIS — Z7982 Long term (current) use of aspirin: Secondary | ICD-10-CM | POA: Insufficient documentation

## 2018-04-05 DIAGNOSIS — I824Z2 Acute embolism and thrombosis of unspecified deep veins of left distal lower extremity: Secondary | ICD-10-CM

## 2018-04-05 LAB — CBC WITH DIFFERENTIAL (CANCER CENTER ONLY)
BASOS ABS: 0 10*3/uL (ref 0.0–0.1)
Basophils Relative: 1 %
EOS PCT: 2 %
Eosinophils Absolute: 0.1 10*3/uL (ref 0.0–0.5)
HCT: 42.2 % (ref 34.8–46.6)
Hemoglobin: 13.3 g/dL (ref 11.6–15.9)
Lymphocytes Relative: 42 %
Lymphs Abs: 2.4 10*3/uL (ref 0.9–3.3)
MCH: 30 pg (ref 26.0–34.0)
MCHC: 31.5 g/dL — AB (ref 32.0–36.0)
MCV: 95.3 fL (ref 81.0–101.0)
MONO ABS: 0.4 10*3/uL (ref 0.1–0.9)
Monocytes Relative: 7 %
NEUTROS ABS: 2.7 10*3/uL (ref 1.5–6.5)
Neutrophils Relative %: 48 %
Platelet Count: 205 10*3/uL (ref 145–400)
RBC: 4.43 MIL/uL (ref 3.70–5.32)
RDW: 13.2 % (ref 11.1–15.7)
WBC Count: 5.6 10*3/uL (ref 3.9–10.0)

## 2018-04-05 LAB — CMP (CANCER CENTER ONLY)
ALT: 14 U/L (ref 0–44)
AST: 20 U/L (ref 15–41)
Albumin: 3.6 g/dL (ref 3.5–5.0)
Alkaline Phosphatase: 84 U/L (ref 38–126)
Anion gap: 7 (ref 5–15)
BUN: 14 mg/dL (ref 6–20)
CO2: 29 mmol/L (ref 22–32)
Calcium: 9.4 mg/dL (ref 8.9–10.3)
Chloride: 105 mmol/L (ref 98–111)
Creatinine: 0.85 mg/dL (ref 0.44–1.00)
Glucose, Bld: 104 mg/dL — ABNORMAL HIGH (ref 70–99)
POTASSIUM: 4.6 mmol/L (ref 3.5–5.1)
Sodium: 141 mmol/L (ref 135–145)
Total Bilirubin: 0.3 mg/dL (ref 0.3–1.2)
Total Protein: 7.1 g/dL (ref 6.5–8.1)

## 2018-04-05 NOTE — Progress Notes (Signed)
Hematology and Oncology Follow Up Visit  Hannah Day [id number] 11/24/1960 57 y.o. 04/05/2018   Principle Diagnosis:   DVT of the left lower leg  Lupus anticoagulant positive  Current Therapy:    Eliquis 2.5 mg by mouth BID -restart on 07/20/2017   Aspirin 81 mg by mouth daily - start in July 2018     Interim History:  Hannah Day is back for follow-up.  She is doing quite well.  She is been quite busy since we last saw her.  Her son got married in June.  He and his wife then moved to [place], [place].  Hopefully, she will be able to see them down in [place], [place].  In June, the also went to the beach.  They had a good time.  She is traveling as always.  She was up in [place] for work.  She goes up there quite a bit.  She is doing well on the aspirin and low-dose Eliquis.  She is had no problems with her legs.  She flies quite a lot.  She wears compression stocking when she flies.  She is had occasional swelling in the left leg.  Her appetite is good.  There is no problems with bleeding.  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  She goes to shows in the fall.    Overall, her performance status is ECOG 0   Medications:  Current Outpatient Medications:  .  acetaminophen (TYLENOL) 500 MG tablet, Take 500 mg by mouth every 6 (six) hours as needed., Disp: , Rfl:  .  apixaban (ELIQUIS) 2.5 MG TABS tablet, Take 1 tablet (2.5 mg total) by mouth 2 (two) times daily., Disp: 60 tablet, Rfl: 12 .  aspirin 81 MG tablet, Take 1 tablet (81 mg total) daily by mouth., Disp: , Rfl: 3 .  cetirizine (ZYRTEC) 10 MG tablet, Take 10 mg by mouth as needed for allergies., Disp: , Rfl:  .  doxycycline (ORACEA) 40 MG capsule, Take 40 mg by mouth every morning.  , Disp: , Rfl:  .  NON FORMULARY, Ambreen two capsules daily, Disp: , Rfl:   Allergies:  Allergies  Allergen Reactions  . Sulfa Antibiotics Hives    Past Medical History, Surgical history, Social history, and Family  History were reviewed and updated.  Review of Systems: Review of Systems  Constitutional: Negative.   HENT: Negative.   Eyes: Negative.   Respiratory: Negative.   Cardiovascular: Negative.   Gastrointestinal: Negative.   Genitourinary: Negative.   Musculoskeletal: Negative.   Skin: Negative.   Neurological: Negative.   Endo/Heme/Allergies: Negative.   Psychiatric/Behavioral: Negative.      Physical Exam:  weight is 202 lb (91.6 kg). Her oral temperature is 98.3 F (36.8 C). Her blood pressure is 130/76 and her pulse is 66. Her respiration is 16 and oxygen saturation is 99%.   Wt Readings from Last 3 Encounters:  04/05/18 202 lb (91.6 kg)  01/06/18 200 lb 4 oz (90.8 kg)  10/05/17 200 lb (90.7 kg)     Physical Exam  Constitutional: She is oriented to person, place, and time.  HENT:  Head: Normocephalic and atraumatic.  Mouth/Throat: Oropharynx is clear and moist.  Eyes: Pupils are equal, round, and reactive to light. EOM are normal.  Neck: Normal range of motion.  Cardiovascular: Normal rate, regular rhythm and normal heart sounds.  Pulmonary/Chest: Effort normal and breath sounds normal.  Abdominal: Soft. Bowel sounds are normal.  Musculoskeletal: Normal range of motion. She exhibits  no edema, tenderness or deformity.  Lymphadenopathy:    She has no cervical adenopathy.  Neurological: She is alert and oriented to person, place, and time.  Skin: Skin is warm and dry. No rash noted. No erythema.  Psychiatric: She has a normal mood and affect. Her behavior is normal. Judgment and thought content normal.  Vitals reviewed.   Lab Results  Component Value Date   WBC 5.6 04/05/2018   HGB 13.3 04/05/2018   HCT 42.2 04/05/2018   MCV 95.3 04/05/2018   PLT 205 04/05/2018     Chemistry      Component Value Date/Time   NA 139 10/05/2017 1023   NA 145 07/20/2017 0926   NA 141 03/12/2016 0858   K 4.5 10/05/2017 1023   K 4.2 07/20/2017 0926   K 4.0 03/12/2016 0858   CL  104 10/05/2017 1023   CL 107 07/20/2017 0926   CO2 28 10/05/2017 1023   CO2 27 07/20/2017 0926   CO2 27 03/12/2016 0858   BUN 13 10/05/2017 1023   BUN 10 07/20/2017 0926   BUN 14.6 03/12/2016 0858   CREATININE 0.87 10/05/2017 1023   CREATININE 0.6 07/20/2017 0926   CREATININE 0.8 03/12/2016 0858      Component Value Date/Time   CALCIUM 9.8 10/05/2017 1023   CALCIUM 9.1 07/20/2017 0926   CALCIUM 9.5 03/12/2016 0858   ALKPHOS 100 10/05/2017 1023   ALKPHOS 77 07/20/2017 0926   ALKPHOS 92 03/12/2016 0858   AST 19 10/05/2017 1023   AST 17 03/12/2016 0858   ALT 13 10/05/2017 1023   ALT 33 07/20/2017 0926   ALT 15 03/12/2016 0858   BILITOT 0.4 10/05/2017 1023   BILITOT 0.39 03/12/2016 0858         Impression and Plan: Hannah Day is a 58 year old white female. She has a thrombus in the Left lower leg. This has resolved.  For right now, we will keep her on the Eliquis and aspirin.  We will go ahead and plan to have her but come back yearly now.  I think yearly would be reasonable.  She has any problems between now and then, she can certainly call me and we can get her in.   Josph Macho, MD 8/5/20199:57 AM

## 2018-06-04 ENCOUNTER — Encounter: Payer: 59 | Admitting: Family Medicine

## 2018-07-26 ENCOUNTER — Other Ambulatory Visit: Payer: Self-pay

## 2018-07-26 ENCOUNTER — Ambulatory Visit (INDEPENDENT_AMBULATORY_CARE_PROVIDER_SITE_OTHER): Payer: 59 | Admitting: Physician Assistant

## 2018-07-26 ENCOUNTER — Encounter: Payer: Self-pay | Admitting: Physician Assistant

## 2018-07-26 VITALS — BP 130/80 | HR 70 | Temp 98.5°F | Resp 14 | Ht 69.0 in | Wt 204.0 lb

## 2018-07-26 DIAGNOSIS — L03012 Cellulitis of left finger: Secondary | ICD-10-CM

## 2018-07-26 MED ORDER — CEPHALEXIN 500 MG PO CAPS: 500.0000 mg | ORAL_CAPSULE | Freq: Two times a day (BID) | ORAL | 0 refills | Status: AC

## 2018-07-26 NOTE — Progress Notes (Signed)
Patient presents to clinic today c/o 2 weeks of pain in left ring finger after a fall. Notes she jammed the finger when falling. Initially had significant pain and swelling of the whole finger along with bruising but this quickly resolved. Notes redness and tenderness of the distal end of finger since then with warmth. Denies any noted puncture during fall. Denies numbness or tingling.   Past Medical History:  Diagnosis Date  . Clotting disorder (HCC)   . DVT (deep venous thrombosis) (HCC)   . Rosacea     Current Outpatient Medications on File Prior to Visit  Medication Sig Dispense Refill  . acetaminophen (TYLENOL) 500 MG tablet Take 500 mg by mouth every 6 (six) hours as needed.    Marland Kitchen apixaban (ELIQUIS) 2.5 MG TABS tablet Take 1 tablet (2.5 mg total) by mouth 2 (two) times daily. 60 tablet 12  . aspirin 81 MG tablet Take 1 tablet (81 mg total) daily by mouth.  3  . cetirizine (ZYRTEC) 10 MG tablet Take 10 mg by mouth as needed for allergies.    Marland Kitchen doxycycline (ORACEA) 40 MG capsule Take 1 capsule by mouth daily.    . NON FORMULARY Ambreen two capsules daily     No current facility-administered medications on file prior to visit.     Allergies  Allergen Reactions  . Sulfa Antibiotics Hives    Family History  Problem Relation Age of Onset  . Cancer Father     Social History   Socioeconomic History  . Marital status: Married    Spouse name: Not on file  . Number of children: Not on file  . Years of education: Not on file  . Highest education level: Not on file  Occupational History  . Not on file  Social Needs  . Financial resource strain: Not on file  . Food insecurity:    Worry: Not on file    Inability: Not on file  . Transportation needs:    Medical: Not on file    Non-medical: Not on file  Tobacco Use  . Smoking status: Never Smoker  . Smokeless tobacco: Never Used  Substance and Sexual Activity  . Alcohol use: No    Alcohol/week: 0.0 standard drinks  .  Drug use: No  . Sexual activity: Yes  Lifestyle  . Physical activity:    Days per week: Not on file    Minutes per session: Not on file  . Stress: Not on file  Relationships  . Social connections:    Talks on phone: Not on file    Gets together: Not on file    Attends religious service: Not on file    Active member of club or organization: Not on file    Attends meetings of clubs or organizations: Not on file    Relationship status: Not on file  Other Topics Concern  . Not on file  Social History Narrative  . Not on file   Review of Systems - See HPI.  All other ROS are negative.  BP 130/80   Pulse 70   Temp 98.5 F (36.9 C) (Oral)   Resp 14   Ht 5\' 9"  (1.753 m)   Wt 204 lb (92.5 kg)   LMP 02/12/2011   SpO2 97%   BMI 30.13 kg/m   Physical Exam  Constitutional: She appears well-developed and well-nourished.  HENT:  Head: Normocephalic and atraumatic.  Cardiovascular: Normal rate, regular rhythm and normal heart sounds.  Pulmonary/Chest: Effort normal.  Musculoskeletal:       Hands: Vitals reviewed.    Assessment/Plan: 1. Cellulitis of finger of left hand Distal end of ring finger. Will start Keflex 500 mg BID. Supportive measures and OTC medications reviewed. No indication of x-ray today. Close follow-up discussed.  - cephALEXin (KEFLEX) 500 MG capsule; Take 1 capsule (500 mg total) by mouth 2 (two) times daily for 7 days.  Dispense: 14 capsule; Refill: 0   Piedad ClimesWilliam Cody Genesee Nase, PA-C

## 2018-07-26 NOTE — Patient Instructions (Signed)
Please wear the finger brace as directed. Ice and elevate the finger.  Tylenol if needed for pain. Giving the sharp delineation.of redness, I am concerned for a mild cellulitis developing from injury. Start the antibiotic and take as directed. If not improving with splint and antibiotic, we will need imaging.

## 2018-07-30 ENCOUNTER — Other Ambulatory Visit: Payer: Self-pay | Admitting: Hematology & Oncology

## 2018-10-11 ENCOUNTER — Encounter: Payer: 59 | Admitting: Family Medicine

## 2018-10-12 ENCOUNTER — Other Ambulatory Visit (HOSPITAL_COMMUNITY)
Admission: RE | Admit: 2018-10-12 | Discharge: 2018-10-12 | Disposition: A | Discharge status: Home / Self Care | Payer: 59 | Source: Ambulatory Visit | Attending: Family Medicine | Admitting: Family Medicine

## 2018-10-12 ENCOUNTER — Ambulatory Visit (INDEPENDENT_AMBULATORY_CARE_PROVIDER_SITE_OTHER): Payer: 59 | Admitting: Family Medicine

## 2018-10-12 ENCOUNTER — Encounter: Payer: Self-pay | Admitting: General Practice

## 2018-10-12 ENCOUNTER — Encounter: Payer: Self-pay | Admitting: Family Medicine

## 2018-10-12 ENCOUNTER — Other Ambulatory Visit: Payer: Self-pay

## 2018-10-12 VITALS — BP 126/74 | HR 80 | Temp 98.1°F | Resp 16 | Ht 69.0 in | Wt 203.5 lb

## 2018-10-12 DIAGNOSIS — Z124 Encounter for screening for malignant neoplasm of cervix: Secondary | ICD-10-CM

## 2018-10-12 DIAGNOSIS — Z0000 Encounter for general adult medical examination without abnormal findings: Secondary | ICD-10-CM | POA: Diagnosis not present

## 2018-10-12 DIAGNOSIS — Z1211 Encounter for screening for malignant neoplasm of colon: Secondary | ICD-10-CM | POA: Diagnosis not present

## 2018-10-12 DIAGNOSIS — E669 Obesity, unspecified: Secondary | ICD-10-CM | POA: Diagnosis not present

## 2018-10-12 DIAGNOSIS — Z1231 Encounter for screening mammogram for malignant neoplasm of breast: Secondary | ICD-10-CM | POA: Diagnosis not present

## 2018-10-12 DIAGNOSIS — E663 Overweight: Secondary | ICD-10-CM | POA: Insufficient documentation

## 2018-10-12 LAB — BASIC METABOLIC PANEL
BUN: 15 mg/dL (ref 6–23)
CALCIUM: 9.2 mg/dL (ref 8.4–10.5)
CHLORIDE: 103 meq/L (ref 96–112)
CO2: 28 mEq/L (ref 19–32)
CREATININE: 0.77 mg/dL (ref 0.40–1.20)
GFR: 77.22 mL/min (ref >60.00)
Glucose, Bld: 83 mg/dL (ref 70–99)
Potassium: 3.9 mEq/L (ref 3.5–5.1)
Sodium: 138 mEq/L (ref 135–145)

## 2018-10-12 LAB — HEPATIC FUNCTION PANEL
ALT: 10 U/L (ref 0–35)
AST: 14 U/L (ref 0–37)
Albumin: 4 g/dL (ref 3.5–5.2)
Alkaline Phosphatase: 87 U/L (ref 39–117)
BILIRUBIN TOTAL: 0.4 mg/dL (ref 0.2–1.2)
Bilirubin, Direct: 0.1 mg/dL (ref 0.0–0.3)
Total Protein: 6.6 g/dL (ref 6.0–8.3)

## 2018-10-12 LAB — CBC WITH DIFFERENTIAL/PLATELET
BASOS ABS: 0 10*3/uL (ref 0.0–0.1)
BASOS PCT: 0.8 % (ref 0.0–3.0)
EOS ABS: 0.1 10*3/uL (ref 0.0–0.7)
EOS PCT: 2 % (ref 0.0–5.0)
HEMATOCRIT: 41.3 % (ref 36.0–46.0)
Hemoglobin: 13.6 g/dL (ref 12.0–15.0)
LYMPHS PCT: 38.4 % (ref 12.0–46.0)
Lymphs Abs: 2.4 10*3/uL (ref 0.7–4.0)
MCHC: 33 g/dL (ref 30.0–36.0)
MCV: 92.4 fl (ref 78.0–100.0)
MONOS PCT: 6.7 % (ref 3.0–12.0)
Monocytes Absolute: 0.4 10*3/uL (ref 0.1–1.0)
Neutro Abs: 3.2 10*3/uL (ref 1.4–7.7)
Neutrophils Relative %: 52.1 % (ref 43.0–77.0)
Platelets: 235 10*3/uL (ref 150.0–400.0)
RBC: 4.46 Mil/uL (ref 3.87–5.11)
RDW: 13.7 % (ref 11.5–15.5)
WBC: 6.2 10*3/uL (ref 4.0–10.5)

## 2018-10-12 LAB — LIPID PANEL
Cholesterol: 196 mg/dL (ref 0–200)
HDL: 40 mg/dL (ref >39.00)
LDL Cholesterol: 128 mg/dL — ABNORMAL HIGH (ref 0–99)
NonHDL: 156.35
Total CHOL/HDL Ratio: 5
Triglycerides: 143 mg/dL (ref 0.0–149.0)
VLDL: 28.6 mg/dL (ref 0.0–40.0)

## 2018-10-12 LAB — TSH: TSH: 3.62 u[IU]/mL (ref 0.35–4.50)

## 2018-10-12 NOTE — Assessment & Plan Note (Signed)
Pt's PE WNL w/ exception of obesity.  Overdue on mammo, colonoscopy- ordered.  Pap done today.  Check labs.  Anticipatory guidance provided.

## 2018-10-12 NOTE — Assessment & Plan Note (Signed)
Ongoing issue for pt.  Stressed need for healthy diet and regular exercise.  Check labs to risk stratify.  Will follow 

## 2018-10-12 NOTE — Progress Notes (Signed)
   Subjective:    Patient ID: Hannah Day, female    DOB: Dec 31, 1960, 58 y.o.   MRN: [id number]  HPI CPE- overdue on pap, mammo, and colonoscopy.  Declines flu shot.   Review of Systems Patient reports no vision/ hearing changes, adenopathy,fever, weight change,  persistant/recurrent hoarseness , swallowing issues, chest pain, palpitations, edema, persistant/recurrent cough, hemoptysis, dyspnea (rest/exertional/paroxysmal nocturnal), gastrointestinal bleeding (melena, rectal bleeding), abdominal pain, significant heartburn, bowel changes, GU symptoms (dysuria, hematuria, incontinence), Gyn symptoms (abnormal  bleeding, pain),  syncope, focal weakness, memory loss, numbness & tingling, skin/hair/nail changes, abnormal bruising or bleeding, anxiety, or depression.     Objective:   Physical Exam  General Appearance:    Alert, cooperative, no distress, appears stated age  Head:    Normocephalic, without obvious abnormality, atraumatic  Eyes:    PERRL, conjunctiva/corneas clear, EOM's intact, fundi    benign, both eyes  Ears:    Normal TM's and external ear canals, both ears  Nose:   Nares normal, septum midline, mucosa normal, no drainage    or sinus tenderness  Throat:   Lips, mucosa, and tongue normal; teeth and gums normal  Neck:   Supple, symmetrical, trachea midline, no adenopathy;    Thyroid: no enlargement/tenderness/nodules  Back:     Symmetric, no curvature, ROM normal, no CVA tenderness  Lungs:     Clear to auscultation bilaterally, respirations unlabored  Chest Wall:    No tenderness or deformity   Heart:    Regular rate and rhythm, S1 and S2 normal, no murmur, rub   or gallop  Breast Exam:    No tenderness, masses, or nipple abnormality  Abdomen:     Soft, non-tender, bowel sounds active all four quadrants,    no masses, no organomegaly  Genitalia:    External genitalia normal, cervix normal in appearance, no CMT, uterus in normal size and position, adnexa w/out mass or  tenderness, mucosa pink and moist, no lesions or discharge present  Rectal:    Normal external appearance  Extremities:   Extremities normal, atraumatic, no cyanosis or edema  Pulses:   2+ and symmetric all extremities  Skin:   Skin color, texture, turgor normal, no rashes or lesions  Lymph nodes:   Cervical, supraclavicular, and axillary nodes normal  Neurologic:   CNII-XII intact, normal strength, sensation and reflexes    throughout          Assessment & Plan:

## 2018-10-12 NOTE — Patient Instructions (Signed)
Follow up in 1 year or as needed We'll notify you of your lab results and make any changes if needed Continue to work on healthy diet and regular exercise- you can do it! We'll call you with your mammogram and GI appt for the colonoscopy consultation Call with any questions or concerns Have a great time at the beach!!

## 2018-10-14 LAB — CYTOLOGY - PAP
Diagnosis: NEGATIVE
Diagnosis: REACTIVE
HPV: NOT DETECTED

## 2018-10-15 ENCOUNTER — Encounter: Payer: Self-pay | Admitting: General Practice

## 2018-11-09 ENCOUNTER — Ambulatory Visit
Admission: RE | Admit: 2018-11-09 | Discharge: 2018-11-09 | Disposition: A | Discharge status: Home / Self Care | Payer: 59 | Source: Ambulatory Visit | Attending: Family Medicine | Admitting: Family Medicine

## 2018-11-09 DIAGNOSIS — Z1231 Encounter for screening mammogram for malignant neoplasm of breast: Secondary | ICD-10-CM

## 2019-04-06 ENCOUNTER — Ambulatory Visit: Payer: 59 | Admitting: Hematology & Oncology

## 2019-04-06 ENCOUNTER — Other Ambulatory Visit: Payer: 59

## 2019-04-29 ENCOUNTER — Other Ambulatory Visit: Payer: Self-pay | Admitting: *Deleted

## 2019-04-29 DIAGNOSIS — I824Z2 Acute embolism and thrombosis of unspecified deep veins of left distal lower extremity: Secondary | ICD-10-CM

## 2019-05-02 ENCOUNTER — Other Ambulatory Visit: Payer: 59

## 2019-05-02 ENCOUNTER — Ambulatory Visit: Payer: 59 | Admitting: Hematology & Oncology

## 2019-05-18 ENCOUNTER — Ambulatory Visit: Payer: 59 | Admitting: Family

## 2019-05-18 ENCOUNTER — Other Ambulatory Visit: Payer: 59

## 2019-06-10 ENCOUNTER — Other Ambulatory Visit: Payer: Self-pay | Admitting: Family

## 2019-06-10 ENCOUNTER — Inpatient Hospital Stay: Payer: 59 | Attending: Family

## 2019-06-10 ENCOUNTER — Encounter: Payer: Self-pay | Admitting: Family

## 2019-06-10 ENCOUNTER — Inpatient Hospital Stay (HOSPITAL_BASED_OUTPATIENT_CLINIC_OR_DEPARTMENT_OTHER): Payer: 59 | Admitting: Family

## 2019-06-10 ENCOUNTER — Other Ambulatory Visit: Payer: Self-pay

## 2019-06-10 VITALS — BP 130/86 | HR 74 | Temp 97.8°F | Resp 18 | Wt 206.0 lb

## 2019-06-10 DIAGNOSIS — D6862 Lupus anticoagulant syndrome: Secondary | ICD-10-CM | POA: Diagnosis not present

## 2019-06-10 DIAGNOSIS — Z86718 Personal history of other venous thrombosis and embolism: Secondary | ICD-10-CM | POA: Diagnosis present

## 2019-06-10 DIAGNOSIS — Z7982 Long term (current) use of aspirin: Secondary | ICD-10-CM | POA: Diagnosis not present

## 2019-06-10 DIAGNOSIS — I824Z2 Acute embolism and thrombosis of unspecified deep veins of left distal lower extremity: Secondary | ICD-10-CM

## 2019-06-10 DIAGNOSIS — Z79899 Other long term (current) drug therapy: Secondary | ICD-10-CM | POA: Insufficient documentation

## 2019-06-10 DIAGNOSIS — Z7901 Long term (current) use of anticoagulants: Secondary | ICD-10-CM | POA: Insufficient documentation

## 2019-06-10 DIAGNOSIS — I825Z2 Chronic embolism and thrombosis of unspecified deep veins of left distal lower extremity: Secondary | ICD-10-CM

## 2019-06-10 LAB — CBC WITH DIFFERENTIAL (CANCER CENTER ONLY)
Abs Immature Granulocytes: 0.01 10*3/uL (ref 0.00–0.07)
Basophils Absolute: 0.1 10*3/uL (ref 0.0–0.1)
Basophils Relative: 1 %
Eosinophils Absolute: 0.1 10*3/uL (ref 0.0–0.5)
Eosinophils Relative: 2 %
HCT: 42.5 % (ref 36.0–46.0)
Hemoglobin: 13.4 g/dL (ref 12.0–15.0)
Immature Granulocytes: 0 %
Lymphocytes Relative: 41 %
Lymphs Abs: 2.8 10*3/uL (ref 0.7–4.0)
MCH: 30.3 pg (ref 26.0–34.0)
MCHC: 31.5 g/dL (ref 30.0–36.0)
MCV: 96.2 fL (ref 80.0–100.0)
Monocytes Absolute: 0.6 10*3/uL (ref 0.1–1.0)
Monocytes Relative: 8 %
Neutro Abs: 3.2 10*3/uL (ref 1.7–7.7)
Neutrophils Relative %: 48 %
Platelet Count: 233 10*3/uL (ref 150–400)
RBC: 4.42 MIL/uL (ref 3.87–5.11)
RDW: 13.1 % (ref 11.5–15.5)
WBC Count: 6.8 10*3/uL (ref 4.0–10.5)
nRBC: 0 % (ref 0.0–0.2)

## 2019-06-10 LAB — CMP (CANCER CENTER ONLY)
ALT: 14 U/L (ref 0–44)
AST: 16 U/L (ref 15–41)
Albumin: 4.1 g/dL (ref 3.5–5.0)
Alkaline Phosphatase: 69 U/L (ref 38–126)
Anion gap: 4 — ABNORMAL LOW (ref 5–15)
BUN: 15 mg/dL (ref 6–20)
CO2: 31 mmol/L (ref 22–32)
Calcium: 9.6 mg/dL (ref 8.9–10.3)
Chloride: 108 mmol/L (ref 98–111)
Creatinine: 0.92 mg/dL (ref 0.44–1.00)
GFR, Est AFR Am: >60 mL/min (ref >60)
GFR, Estimated: >60 mL/min (ref >60)
Glucose, Bld: 99 mg/dL (ref 70–99)
Potassium: 4.9 mmol/L (ref 3.5–5.1)
Sodium: 143 mmol/L (ref 135–145)
Total Bilirubin: 0.4 mg/dL (ref 0.3–1.2)
Total Protein: 7 g/dL (ref 6.5–8.1)

## 2019-06-10 MED ORDER — APIXABAN 2.5 MG PO TABS: 2.5000 mg | ORAL_TABLET | Freq: Two times a day (BID) | ORAL | 12 refills | Status: DC

## 2019-06-10 NOTE — Progress Notes (Signed)
Hematology and Oncology Follow Up Visit  Hannah Day [id number] [birth year] 58 y.o. 06/10/2019   Principle Diagnosis:  DVT of the left lower leg - resolved Lupus anticoagulant positive  Current Therapy:   Eliquis 2.5 mg by mouth BID - restart on 07/20/2017  Aspirin 81 mg by mouth daily - start in July 2018   Interim History:  Hannah Day is here today for follow-up. She is doing quite well and has no complaints at this time.  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  She continues to tolerate aspirin and Eliquis nicely and has had no issues with bleeding, bruising or petechiae.  No fever, chills, n/v, cough, rash, dizziness, SOB, chest pain, palpitations, abdominal pain or changes in bowel or bladder habits.  She has puffiness in her ankles that waxes and wanes. No tenderness, numbness or tingling in her extremities.  She wears her compression stocking as needed.  She has maintained a good appetite and is staying well hydrated. Her weight is stable.   ECOG Performance Status: 0 - Asymptomatic  Medications:  Allergies as of 06/10/2019      Reactions   Sulfa Antibiotics Hives      Medication List       Accurate as of June 10, 2019  9:11 AM. If you have any questions, ask your nurse or doctor.        acetaminophen 500 MG tablet Commonly known as: TYLENOL Take 500 mg by mouth every 6 (six) hours as needed.   apixaban 2.5 MG Tabs tablet Commonly known as: ELIQUIS Take 1 tablet (2.5 mg total) by mouth 2 (two) times daily.   aspirin 81 MG tablet Take 1 tablet (81 mg total) daily by mouth.   cetirizine 10 MG tablet Commonly known as: ZYRTEC Take 10 mg by mouth as needed for allergies.   NON FORMULARY Ambreen two capsules daily   Oracea 40 MG capsule Generic drug: doxycycline Take 1 capsule by mouth daily.       Allergies:  Allergies  Allergen Reactions  . Sulfa Antibiotics Hives    Past Medical History, Surgical  history, Social history, and Family History were reviewed and updated.  Review of Systems: All other 10 point review of systems is negative.   Physical Exam:  weight is 206 lb (93.4 kg). Her temporal temperature is 97.8 F (36.6 C). Her blood pressure is 130/86 and her pulse is 74. Her respiration is 18 and oxygen saturation is 99%.   Wt Readings from Last 3 Encounters:  06/10/19 206 lb (93.4 kg)  10/12/18 203 lb 8 oz (92.3 kg)  07/26/18 204 lb (92.5 kg)    Ocular: Sclerae unicteric, pupils equal, round and reactive to light Ear-nose-throat: Oropharynx clear, dentition fair Lymphatic: No cervical or supraclavicular adenopathy Lungs no rales or rhonchi, good excursion bilaterally Heart regular rate and rhythm, no murmur appreciated Abd soft, nontender, positive bowel sounds, no liver or spleen tip palpated on exam, no fluid wave  MSK no focal spinal tenderness, no joint edema Neuro: non-focal, well-oriented, appropriate affect Breasts: Deferred   Lab Results  Component Value Date   WBC 6.8 06/10/2019   HGB 13.4 06/10/2019   HCT 42.5 06/10/2019   MCV 96.2 06/10/2019   PLT 233 06/10/2019   No results found for: FERRITIN, IRON, TIBC, UIBC, IRONPCTSAT Lab Results  Component Value Date   RBC 4.42 06/10/2019   No results found for: KPAFRELGTCHN, LAMBDASER, KAPLAMBRATIO No results found for:  IGGSERUM, IGA, IGMSERUM No results found for: Georgann Housekeeper, MSPIKE, SPEI   Chemistry      Component Value Date/Time   NA 143 06/10/2019 0817   NA 145 07/20/2017 0926   NA 141 03/12/2016 0858   K 4.9 06/10/2019 0817   K 4.2 07/20/2017 0926   K 4.0 03/12/2016 0858   CL 108 06/10/2019 0817   CL 107 07/20/2017 0926   CO2 31 06/10/2019 0817   CO2 27 07/20/2017 0926   CO2 27 03/12/2016 0858   BUN 15 06/10/2019 0817   BUN 10 07/20/2017 0926   BUN 14.6 03/12/2016 0858   CREATININE 0.92 06/10/2019 0817   CREATININE 0.6 07/20/2017 0926    CREATININE 0.8 03/12/2016 0858      Component Value Date/Time   CALCIUM 9.6 06/10/2019 0817   CALCIUM 9.1 07/20/2017 0926   CALCIUM 9.5 03/12/2016 0858   ALKPHOS 69 06/10/2019 0817   ALKPHOS 77 07/20/2017 0926   ALKPHOS 92 03/12/2016 0858   AST 16 06/10/2019 0817   AST 17 03/12/2016 0858   ALT 14 06/10/2019 0817   ALT 33 07/20/2017 0926   ALT 15 03/12/2016 0858   BILITOT 0.4 06/10/2019 0817   BILITOT 0.39 03/12/2016 0858       Impression and Plan: Hannah Day is a very pleasant 58 yo caucasian female with history of left lower extremity DVT as well as positive lupus anticoagulant.  She continues to do well and has no complaints at this time.  She will continue her same regimen with maintenance Eliquis and daily baby aspirin.  We will plan to see her back in another year.  She will contact our office with any questions or concerns. We can certainly see her sooner if needed.   Emeline Gins, NP 10/9/20209:11 AM

## 2019-07-30 ENCOUNTER — Other Ambulatory Visit: Payer: Self-pay | Admitting: Hematology & Oncology

## 2019-07-30 DIAGNOSIS — I825Z2 Chronic embolism and thrombosis of unspecified deep veins of left distal lower extremity: Secondary | ICD-10-CM

## 2019-09-19 ENCOUNTER — Encounter: Payer: Self-pay | Admitting: Physician Assistant

## 2019-09-19 ENCOUNTER — Other Ambulatory Visit: Payer: Self-pay

## 2019-09-19 ENCOUNTER — Ambulatory Visit (INDEPENDENT_AMBULATORY_CARE_PROVIDER_SITE_OTHER): Payer: 59 | Admitting: Physician Assistant

## 2019-09-19 VITALS — BP 128/80 | HR 83 | Temp 97.4°F | Resp 99 | Ht 69.0 in | Wt 210.2 lb

## 2019-09-19 DIAGNOSIS — T162XXA Foreign body in left ear, initial encounter: Secondary | ICD-10-CM

## 2019-09-19 NOTE — Patient Instructions (Signed)
Please keep skin clean and dry. Avoid use of q-tips. You can use a capful of hydrogen peroxide into the ear canal once weekly or every other week. This will promote the ear wax to loosen and come out on its on.   Follow these instructions at home:   Take over-the-counter and prescription medicines only as told by your health care provider.  Do not put any objects, including cotton swabs, into your ear. You can clean the opening of your ear canal with a washcloth or facial tissue.  Follow instructions from your health care provider about cleaning your ears. Do not over-clean your ears.  Drink enough fluid to keep your urine clear or pale yellow. This will help to thin the earwax.  Keep all follow-up visits as told by your health care provider. If earwax builds up in your ears often or if you use hearing aids, consider seeing your health care provider for routine, preventive ear cleanings. Ask your health care provider how often you should schedule your cleanings.  If you have hearing aids, clean them according to instructions from the manufacturer and your health care provider.   Contact a health care provider if:  You have ear pain.  You develop a fever.  You have blood, pus, or other fluid coming from your ear.  You have hearing loss.  You have ringing in your ears that does not go away.  Your symptoms do not improve with treatment.  You feel like the room is spinning (vertigo).

## 2019-09-19 NOTE — Progress Notes (Signed)
Patient presents to clinic today after getting the end of a Q-tip stuck in her ear canal.  Patient endorses she was cleaning her ears out this morning.  While she was cleaning her left ear she noted the cotton end of the Q-tip did not come out of her ear.  Notes sensation of something stuck in her ear.  Denies decreased hearing.  Denies ear pain.  Denies drainage from the ear.  Otherwise asymptomatic.  Past Medical History:  Diagnosis Date  . Clotting disorder (HCC)   . DVT (deep venous thrombosis) (HCC)   . Rosacea     Current Outpatient Medications on File Prior to Visit  Medication Sig Dispense Refill  . acetaminophen (TYLENOL) 500 MG tablet Take 500 mg by mouth every 6 (six) hours as needed.    Marland Kitchen aspirin 81 MG tablet Take 1 tablet (81 mg total) daily by mouth.  3  . cetirizine (ZYRTEC) 10 MG tablet Take 10 mg by mouth as needed for allergies.    Marland Kitchen doxycycline (ORACEA) 40 MG capsule Take 1 capsule by mouth daily.    Marland Kitchen ELIQUIS 2.5 MG TABS tablet TAKE 1 TABLET(2.5 MG) BY MOUTH TWICE DAILY 60 tablet 12  . NON FORMULARY Ambreen two capsules daily     No current facility-administered medications on file prior to visit.    Allergies  Allergen Reactions  . Sulfa Antibiotics Hives    Family History  Problem Relation Age of Onset  . Cancer Father     Social History   Socioeconomic History  . Marital status: Married    Spouse name: Not on file  . Number of children: Not on file  . Years of education: Not on file  . Highest education level: Not on file  Occupational History  . Not on file  Tobacco Use  . Smoking status: Never Smoker  . Smokeless tobacco: Never Used  Substance and Sexual Activity  . Alcohol use: No    Alcohol/week: 0.0 standard drinks  . Drug use: No  . Sexual activity: Yes  Other Topics Concern  . Not on file  Social History Narrative  . Not on file   Social Determinants of Health   Financial Resource Strain:   . Difficulty of Paying Living  Expenses: Not on file  Food Insecurity:   . Worried About Programme researcher, broadcasting/film/video in the Last Year: Not on file  . Ran Out of Food in the Last Year: Not on file  Transportation Needs:   . Lack of Transportation (Medical): Not on file  . Lack of Transportation (Non-Medical): Not on file  Physical Activity:   . Days of Exercise per Week: Not on file  . Minutes of Exercise per Session: Not on file  Stress:   . Feeling of Stress : Not on file  Social Connections:   . Frequency of Communication with Friends and Family: Not on file  . Frequency of Social Gatherings with Friends and Family: Not on file  . Attends Religious Services: Not on file  . Active Member of Clubs or Organizations: Not on file  . Attends Banker Meetings: Not on file  . Marital Status: Not on file   Review of Systems - See HPI.  All other ROS are negative.  Wt 210 lb 3.2 oz (95.3 kg)   LMP 02/12/2011   BMI 31.04 kg/m   Physical Exam HENT:     Head: Normocephalic and atraumatic.     Left Ear: Tympanic membrane  and external ear normal. No decreased hearing noted. No laceration, swelling or tenderness.  No middle ear effusion. There is no impacted cerumen. A foreign body is present. No hemotympanum. Tympanic membrane is not injected, scarred, erythematous, retracted or bulging.    Assessment/Plan: 1. Foreign body of left ear, initial encounter Left ear canal.  Able to successfully remove with use of lighted curette and alligator forceps.  Patient tolerated well.  No pain noted during procedure.  Ear canal reexamined after removal of foreign body.  No evidence of cerumen impaction.  TM intact.  Ear canal slightly erythematous but without swelling.  Home care instructions reviewed.  Signs/symptoms of infection reviewed with patient.  She is to call us if any of these occur.  Encouraged her to avoid use of Q-tips in the future.  Proper ear cleaning reviewed.  Handout given.  Follow-up as needed.   This visit  occurred during the SARS-CoV-2 public health emergency.  Safety protocols were in place, including screening questions prior to the visit, additional usage of staff PPE, and extensive cleaning of exam room while observing appropriate contact time as indicated for disinfecting solutions.     Leeanne Rio, PA-C

## 2019-10-10 ENCOUNTER — Other Ambulatory Visit: Payer: Self-pay | Admitting: Family Medicine

## 2019-10-10 DIAGNOSIS — Z1231 Encounter for screening mammogram for malignant neoplasm of breast: Secondary | ICD-10-CM

## 2019-11-18 ENCOUNTER — Ambulatory Visit: Payer: 59

## 2019-12-01 ENCOUNTER — Ambulatory Visit
Admission: RE | Admit: 2019-12-01 | Discharge: 2019-12-01 | Disposition: A | Discharge status: Home / Self Care | Payer: 59 | Source: Ambulatory Visit | Attending: Family Medicine | Admitting: Family Medicine

## 2019-12-01 ENCOUNTER — Other Ambulatory Visit: Payer: Self-pay

## 2019-12-01 DIAGNOSIS — Z1231 Encounter for screening mammogram for malignant neoplasm of breast: Secondary | ICD-10-CM

## 2020-07-08 ENCOUNTER — Other Ambulatory Visit: Payer: Self-pay | Admitting: Family

## 2020-07-08 DIAGNOSIS — I825Z2 Chronic embolism and thrombosis of unspecified deep veins of left distal lower extremity: Secondary | ICD-10-CM

## 2020-10-31 ENCOUNTER — Encounter: Payer: Self-pay | Admitting: Family Medicine

## 2020-10-31 ENCOUNTER — Ambulatory Visit (INDEPENDENT_AMBULATORY_CARE_PROVIDER_SITE_OTHER): Payer: 59 | Admitting: Family Medicine

## 2020-10-31 ENCOUNTER — Other Ambulatory Visit: Payer: Self-pay

## 2020-10-31 VITALS — BP 130/80 | HR 81 | Temp 97.6°F | Resp 17 | Ht 69.0 in | Wt 214.2 lb

## 2020-10-31 DIAGNOSIS — H3581 Retinal edema: Secondary | ICD-10-CM | POA: Diagnosis not present

## 2020-10-31 DIAGNOSIS — Z1211 Encounter for screening for malignant neoplasm of colon: Secondary | ICD-10-CM

## 2020-10-31 DIAGNOSIS — E669 Obesity, unspecified: Secondary | ICD-10-CM

## 2020-10-31 LAB — CBC WITH DIFFERENTIAL/PLATELET
Basophils Absolute: 0.1 10*3/uL (ref 0.0–0.1)
Basophils Relative: 1.2 % (ref 0.0–3.0)
Eosinophils Absolute: 0.2 10*3/uL (ref 0.0–0.7)
Eosinophils Relative: 2.6 % (ref 0.0–5.0)
HCT: 40.7 % (ref 36.0–46.0)
Hemoglobin: 13.4 g/dL (ref 12.0–15.0)
Lymphocytes Relative: 40 % (ref 12.0–46.0)
Lymphs Abs: 2.5 10*3/uL (ref 0.7–4.0)
MCHC: 33 g/dL (ref 30.0–36.0)
MCV: 92.2 fl (ref 78.0–100.0)
Monocytes Absolute: 0.5 10*3/uL (ref 0.1–1.0)
Monocytes Relative: 8 % (ref 3.0–12.0)
Neutro Abs: 3 10*3/uL (ref 1.4–7.7)
Neutrophils Relative %: 48.2 % (ref 43.0–77.0)
Platelets: 226 10*3/uL (ref 150.0–400.0)
RBC: 4.41 Mil/uL (ref 3.87–5.11)
RDW: 13.4 % (ref 11.5–15.5)
WBC: 6.3 10*3/uL (ref 4.0–10.5)

## 2020-10-31 LAB — BASIC METABOLIC PANEL
BUN: 13 mg/dL (ref 6–23)
CO2: 30 mEq/L (ref 19–32)
Calcium: 9.7 mg/dL (ref 8.4–10.5)
Chloride: 103 mEq/L (ref 96–112)
Creatinine, Ser: 0.89 mg/dL (ref 0.40–1.20)
GFR: 71.06 mL/min (ref >60.00)
Glucose, Bld: 82 mg/dL (ref 70–99)
Potassium: 4 mEq/L (ref 3.5–5.1)
Sodium: 138 mEq/L (ref 135–145)

## 2020-10-31 LAB — LIPID PANEL
Cholesterol: 194 mg/dL (ref 0–200)
HDL: 49.4 mg/dL (ref >39.00)
LDL Cholesterol: 125 mg/dL — ABNORMAL HIGH (ref 0–99)
NonHDL: 144.93
Total CHOL/HDL Ratio: 4
Triglycerides: 98 mg/dL (ref 0.0–149.0)
VLDL: 19.6 mg/dL (ref 0.0–40.0)

## 2020-10-31 LAB — HEPATIC FUNCTION PANEL
ALT: 16 U/L (ref 0–35)
AST: 21 U/L (ref 0–37)
Albumin: 3.9 g/dL (ref 3.5–5.2)
Alkaline Phosphatase: 68 U/L (ref 39–117)
Bilirubin, Direct: 0.1 mg/dL (ref 0.0–0.3)
Total Bilirubin: 0.4 mg/dL (ref 0.2–1.2)
Total Protein: 7.1 g/dL (ref 6.0–8.3)

## 2020-10-31 NOTE — Patient Instructions (Addendum)
Follow up in 3-4 months to recheck blood pressure We'll notify you of your lab results and make any changes if needed We'll call you with your GI appt Continue to work on healthy diet and regular exercise- you can do it! Call with any questions or concerns Stay Safe!  Stay Healthy!

## 2020-10-31 NOTE — Assessment & Plan Note (Signed)
New.  Per eye doctor report.  I have not yet seen the office note but based on pt's description they are talking about a cotton wool spot.  Given her hx of a clotting disorder and use of eliquis, my suspicion would be a small clot.  Her BP is mildly elevated today but not high enough for hypertensive retinopathy.  We will continue to follow her BP and her eye doctor has arranged close follow up.  Encouraged low salt diet, regular exercise.

## 2020-10-31 NOTE — Assessment & Plan Note (Signed)
Deteriorated.  Pt has gained 10 lbs since last visit in 2020.  Discussed that the most effective plans for sustainable weight loss would be [employer] or [employer].  Talked about low carb diet, incorporating regular activity.  Will check labs to risk stratify.  Will follow.

## 2020-10-31 NOTE — Progress Notes (Signed)
   Subjective:    Patient ID: Hannah Day, female    DOB: 07/22/1961, 60 y.o.   MRN: [id number]  HPI Abnormality on eye exam- went to eye doctor on Monday and when eyes were dilated there was a white spot noted that she was told could be associated w/ HTN.  After 3 attempts at using a wrist cuff- after being told she may have HTN- BP reading 140/80.  She reports she took Mucinex D that morning.  Goes to [facility].  Pt has hx of clotting disorder and is on Eliquis daily.  Pt said the spot was referred to as a 'cotton spot' Denies CP, SOB, HAs, blurry or double vision.  Obesity- pt has gained 10 lbs since 2020.  Not following any particular diet and not exercising regularly.  Wants to know if there's something that will help   Review of Systems For ROS see HPI   This visit occurred during the SARS-CoV-2 public health emergency.  Safety protocols were in place, including screening questions prior to the visit, additional usage of staff PPE, and extensive cleaning of exam room while observing appropriate contact time as indicated for disinfecting solutions.       Objective:   Physical Exam Vitals reviewed.  Constitutional:      General: She is not in acute distress.    Appearance: Normal appearance. She is well-developed and well-nourished. She is not ill-appearing.  HENT:     Head: Normocephalic and atraumatic.  Eyes:     Extraocular Movements: EOM normal.     Conjunctiva/sclera: Conjunctivae normal.     Pupils: Pupils are equal, round, and reactive to light.  Neck:     Thyroid: No thyromegaly.  Cardiovascular:     Rate and Rhythm: Normal rate and regular rhythm.     Pulses: Normal pulses and intact distal pulses.     Heart sounds: Normal heart sounds. No murmur heard.   Pulmonary:     Effort: Pulmonary effort is normal. No respiratory distress.     Breath sounds: Normal breath sounds.  Abdominal:     General: There is no distension.     Palpations: Abdomen is soft.      Tenderness: There is no abdominal tenderness.  Musculoskeletal:        General: No edema.     Cervical back: Normal range of motion and neck supple.     Right lower leg: No edema.     Left lower leg: No edema.  Lymphadenopathy:     Cervical: No cervical adenopathy.  Skin:    General: Skin is warm and dry.  Neurological:     Mental Status: She is alert and oriented to person, place, and time.  Psychiatric:        Mood and Affect: Mood and affect normal.        Behavior: Behavior normal.           Assessment & Plan:

## 2020-11-01 LAB — TSH: TSH: 3.91 u[IU]/mL (ref 0.35–4.50)

## 2020-12-24 ENCOUNTER — Other Ambulatory Visit: Payer: Self-pay | Admitting: Family Medicine

## 2020-12-24 DIAGNOSIS — Z1231 Encounter for screening mammogram for malignant neoplasm of breast: Secondary | ICD-10-CM

## 2020-12-25 ENCOUNTER — Other Ambulatory Visit: Payer: Self-pay

## 2020-12-25 ENCOUNTER — Ambulatory Visit
Admission: RE | Admit: 2020-12-25 | Discharge: 2020-12-25 | Disposition: A | Discharge status: Home / Self Care | Payer: 59 | Source: Ambulatory Visit | Attending: Family Medicine | Admitting: Family Medicine

## 2020-12-25 DIAGNOSIS — Z1231 Encounter for screening mammogram for malignant neoplasm of breast: Secondary | ICD-10-CM

## 2021-01-31 ENCOUNTER — Ambulatory Visit: Payer: 59 | Admitting: Family Medicine

## 2021-02-14 ENCOUNTER — Ambulatory Visit: Payer: 59 | Admitting: Family Medicine

## 2021-02-22 ENCOUNTER — Telehealth: Payer: Self-pay | Admitting: Family Medicine

## 2021-02-22 ENCOUNTER — Ambulatory Visit (INDEPENDENT_AMBULATORY_CARE_PROVIDER_SITE_OTHER): Payer: 59 | Admitting: Family Medicine

## 2021-02-22 ENCOUNTER — Encounter: Payer: Self-pay | Admitting: Family Medicine

## 2021-02-22 ENCOUNTER — Other Ambulatory Visit: Payer: Self-pay

## 2021-02-22 VITALS — BP 120/77 | HR 64 | Temp 97.0°F | Resp 20 | Ht 69.0 in | Wt 192.8 lb

## 2021-02-22 DIAGNOSIS — H3581 Retinal edema: Secondary | ICD-10-CM

## 2021-02-22 DIAGNOSIS — E669 Obesity, unspecified: Secondary | ICD-10-CM

## 2021-02-22 DIAGNOSIS — R600 Localized edema: Secondary | ICD-10-CM | POA: Insufficient documentation

## 2021-02-22 LAB — BASIC METABOLIC PANEL
BUN: 15 mg/dL (ref 6–23)
CO2: 28 mEq/L (ref 19–32)
Calcium: 9.9 mg/dL (ref 8.4–10.5)
Chloride: 104 mEq/L (ref 96–112)
Creatinine, Ser: 0.78 mg/dL (ref 0.40–1.20)
GFR: 83.06 mL/min (ref >60.00)
Glucose, Bld: 72 mg/dL (ref 70–99)
Potassium: 3.8 mEq/L (ref 3.5–5.1)
Sodium: 140 mEq/L (ref 135–145)

## 2021-02-22 MED ORDER — FUROSEMIDE 20 MG PO TABS: 20.0000 mg | ORAL_TABLET | Freq: Every day | ORAL | 3 refills | Status: DC

## 2021-02-22 NOTE — Assessment & Plan Note (Signed)
New to provider, ongoing for pt.  Discussed Lasix daily vs PRN and pt is interested in seeing if it would help.  Check BMP today and will continue to monitor swelling and electrolytes.  Pt expressed understanding and is in agreement w/ plan.

## 2021-02-22 NOTE — Progress Notes (Signed)
   Subjective:    Patient ID: Hannah Day, female    DOB: September 16, 1960, 60 y.o.   MRN: [id number]  HPI Cotton wool spots- no change in vision since last visit.  BP is well controlled.  Obesity- pt is down 21 lbs since last visit.  Currently doing [employer].  Pt is walking for exercise.  No CP, SOB, HAs.  Edema has improved since losing weight but she continues to have bilateral leg swelling daily.  Legs swell more w/ heat and when she spends more time on her feet.   Review of Systems For ROS see HPI   This visit occurred during the SARS-CoV-2 public health emergency.  Safety protocols were in place, including screening questions prior to the visit, additional usage of staff PPE, and extensive cleaning of exam room while observing appropriate contact time as indicated for disinfecting solutions.      Objective:   Physical Exam Vitals reviewed.  Constitutional:      General: She is not in acute distress.    Appearance: Normal appearance. She is well-developed. She is not ill-appearing.  HENT:     Head: Normocephalic and atraumatic.  Eyes:     Extraocular Movements: Extraocular movements intact.     Conjunctiva/sclera: Conjunctivae normal.     Pupils: Pupils are equal, round, and reactive to light.  Neck:     Thyroid: No thyromegaly.  Cardiovascular:     Rate and Rhythm: Normal rate and regular rhythm.     Heart sounds: Normal heart sounds. No murmur heard. Pulmonary:     Effort: Pulmonary effort is normal. No respiratory distress.     Breath sounds: Normal breath sounds.  Abdominal:     General: There is no distension.     Palpations: Abdomen is soft.     Tenderness: There is no abdominal tenderness.  Musculoskeletal:     Cervical back: Normal range of motion and neck supple.     Right lower leg: Edema (trace to 1+) present.     Left lower leg: Edema (trace to 1+) present.  Lymphadenopathy:     Cervical: No cervical adenopathy.  Skin:    General: Skin is warm and dry.   Neurological:     Mental Status: She is alert and oriented to person, place, and time.  Psychiatric:        Behavior: Behavior normal.         Assessment & Plan:

## 2021-02-22 NOTE — Telephone Encounter (Signed)
Pt called in stating that the pharmacy told pt that the Lasix had a sulfa component in it. She states that she is allergic to sulfa. Please advise    Pt is aware that Dr. Beverely Low is already gone for the day.

## 2021-02-22 NOTE — Patient Instructions (Signed)
Schedule your complete physical in 6 months We'll notify you of your lab results and make any changes if needed Start the Lasix either daily- or as needed- for swelling Continue to drink LOTS of fluids I'm SO proud of your weight loss progress!  Keep up the good work! Call with any questions or concerns Have a great summer!!!

## 2021-02-22 NOTE — Telephone Encounter (Signed)
Patient will need an alternate med because she is allergic to the sulfa in the lasix that was prescribed.

## 2021-02-22 NOTE — Assessment & Plan Note (Signed)
Pt is down 21 lbs w/ the help of [employer] and walking.  Applauded her efforts and encouraged her to continue.  Will follow.

## 2021-02-22 NOTE — Assessment & Plan Note (Signed)
Thankfully no visual changes and BP remains normal.  Applauded her weight loss efforts.  Will continue to follow.

## 2021-02-25 ENCOUNTER — Other Ambulatory Visit: Payer: Self-pay

## 2021-02-25 MED ORDER — TRIAMTERENE 100 MG PO CAPS: 100.0000 mg | ORAL_CAPSULE | Freq: Every day | ORAL | 3 refills | Status: DC

## 2021-02-25 NOTE — Telephone Encounter (Signed)
Without getting into too much super complicated pathophysiology, Lasix is a loop diuretic which is considered stronger than Triamterene which is called a potassium sparing diuretic.  Lasix can cause potassium to drop low while triamterene does not.

## 2021-02-25 NOTE — Telephone Encounter (Signed)
PCP recommendations given to patient. Patient would like to know the difference between the 2 medications.(Lasix vs Triamterene) Does one work better than the other so she can weigh her options.

## 2021-02-25 NOTE — Telephone Encounter (Signed)
Typically people allergic to sulfa antibiotics are able to take diuretics as there is a possible but not likely cross-reactivity.  If she had anaphylaxis as the reaction, it is not recommended, but any other reaction is typically ok to try the fluid pill.  If she is not comfortable with that, we can switch to Triamterene 100mg  daily, #30, 3 refills

## 2021-02-25 NOTE — Telephone Encounter (Signed)
Ok to send the 100mg  daily, #30, 3 refills as listed below

## 2021-02-25 NOTE — Telephone Encounter (Signed)
Called patient with pcp information. Patient states she would like rx for triamterene sent to her pharmacy.

## 2021-02-25 NOTE — Telephone Encounter (Signed)
Medication sent to pharmacy  

## 2021-02-27 ENCOUNTER — Encounter: Payer: Self-pay | Admitting: *Deleted

## 2021-06-13 ENCOUNTER — Other Ambulatory Visit: Payer: Self-pay | Admitting: Family Medicine

## 2021-07-19 ENCOUNTER — Other Ambulatory Visit: Payer: Self-pay | Admitting: Hematology & Oncology

## 2021-07-19 ENCOUNTER — Telehealth: Payer: Self-pay | Admitting: *Deleted

## 2021-07-19 DIAGNOSIS — I825Z2 Chronic embolism and thrombosis of unspecified deep veins of left distal lower extremity: Secondary | ICD-10-CM

## 2021-07-19 NOTE — Telephone Encounter (Signed)
Patient Request for Eliquis.  Message sent to schedulers to schedule patient for appt since hasnt been seen in office since 2020.  1 refill sent until appointment

## 2021-07-23 ENCOUNTER — Inpatient Hospital Stay: Payer: 59 | Attending: Hematology & Oncology

## 2021-07-23 ENCOUNTER — Encounter: Payer: Self-pay | Admitting: Family

## 2021-07-23 ENCOUNTER — Other Ambulatory Visit: Payer: Self-pay | Admitting: Family

## 2021-07-23 ENCOUNTER — Other Ambulatory Visit: Payer: Self-pay

## 2021-07-23 ENCOUNTER — Inpatient Hospital Stay (HOSPITAL_BASED_OUTPATIENT_CLINIC_OR_DEPARTMENT_OTHER): Payer: 59 | Admitting: Family

## 2021-07-23 VITALS — BP 123/80 | HR 65 | Temp 97.8°F | Resp 17 | Ht 69.0 in | Wt 182.0 lb

## 2021-07-23 DIAGNOSIS — Z7901 Long term (current) use of anticoagulants: Secondary | ICD-10-CM | POA: Insufficient documentation

## 2021-07-23 DIAGNOSIS — Z79899 Other long term (current) drug therapy: Secondary | ICD-10-CM | POA: Diagnosis not present

## 2021-07-23 DIAGNOSIS — D6862 Lupus anticoagulant syndrome: Secondary | ICD-10-CM | POA: Insufficient documentation

## 2021-07-23 DIAGNOSIS — Z7982 Long term (current) use of aspirin: Secondary | ICD-10-CM | POA: Insufficient documentation

## 2021-07-23 DIAGNOSIS — I82409 Acute embolism and thrombosis of unspecified deep veins of unspecified lower extremity: Secondary | ICD-10-CM | POA: Diagnosis not present

## 2021-07-23 DIAGNOSIS — I82492 Acute embolism and thrombosis of other specified deep vein of left lower extremity: Secondary | ICD-10-CM

## 2021-07-23 DIAGNOSIS — D6859 Other primary thrombophilia: Secondary | ICD-10-CM

## 2021-07-23 DIAGNOSIS — Z86718 Personal history of other venous thrombosis and embolism: Secondary | ICD-10-CM | POA: Insufficient documentation

## 2021-07-23 LAB — CBC WITH DIFFERENTIAL (CANCER CENTER ONLY)
Abs Immature Granulocytes: 0.02 10*3/uL (ref 0.00–0.07)
Basophils Absolute: 0.1 10*3/uL (ref 0.0–0.1)
Basophils Relative: 1 %
Eosinophils Absolute: 0.1 10*3/uL (ref 0.0–0.5)
Eosinophils Relative: 1 %
HCT: 40.4 % (ref 36.0–46.0)
Hemoglobin: 13.2 g/dL (ref 12.0–15.0)
Immature Granulocytes: 0 %
Lymphocytes Relative: 47 %
Lymphs Abs: 3.1 10*3/uL (ref 0.7–4.0)
MCH: 31.3 pg (ref 26.0–34.0)
MCHC: 32.7 g/dL (ref 30.0–36.0)
MCV: 95.7 fL (ref 80.0–100.0)
Monocytes Absolute: 0.5 10*3/uL (ref 0.1–1.0)
Monocytes Relative: 7 %
Neutro Abs: 2.9 10*3/uL (ref 1.7–7.7)
Neutrophils Relative %: 44 %
Platelet Count: 225 10*3/uL (ref 150–400)
RBC: 4.22 MIL/uL (ref 3.87–5.11)
RDW: 12.5 % (ref 11.5–15.5)
WBC Count: 6.7 10*3/uL (ref 4.0–10.5)
nRBC: 0 % (ref 0.0–0.2)

## 2021-07-23 LAB — CMP (CANCER CENTER ONLY)
ALT: 10 U/L (ref 0–44)
AST: 16 U/L (ref 15–41)
Albumin: 4.2 g/dL (ref 3.5–5.0)
Alkaline Phosphatase: 76 U/L (ref 38–126)
Anion gap: 7 (ref 5–15)
BUN: 14 mg/dL (ref 6–20)
CO2: 28 mmol/L (ref 22–32)
Calcium: 10.2 mg/dL (ref 8.9–10.3)
Chloride: 104 mmol/L (ref 98–111)
Creatinine: 1.01 mg/dL — ABNORMAL HIGH (ref 0.44–1.00)
GFR, Estimated: >60 mL/min (ref >60)
Glucose, Bld: 84 mg/dL (ref 70–99)
Potassium: 4.3 mmol/L (ref 3.5–5.1)
Sodium: 139 mmol/L (ref 135–145)
Total Bilirubin: 0.6 mg/dL (ref 0.3–1.2)
Total Protein: 7.1 g/dL (ref 6.5–8.1)

## 2021-07-23 LAB — D-DIMER, QUANTITATIVE: D-Dimer, Quant: <0.27 ug/mL-FEU (ref 0.00–0.50)

## 2021-07-23 NOTE — Progress Notes (Signed)
Hematology and Oncology Follow Up Visit  NESIAH JUMP [id number] 04-17-1961 60 y.o. 07/23/2021   Principle Diagnosis:  DVT of the left lower leg - resolved Lupus anticoagulant positive   Current Therapy:        Eliquis 2.5 mg by mouth BID - restart on 07/20/2017  Aspirin 81 mg by mouth daily - start in July 2018   Interim History:  Ms. Foody is here today to re-establish care. She continues to do well. No new thrombotic events to report.  She is doing well on Eliquis and aspirin and has had no issue with abnormal blood loss. No bruising or petechiae.  No fever, chills, n/v, cough, rash, dizziness, SOB, chest pain, palpitations, abdominal pain or changes in bowel or bladder habits.  No swelling, tenderness, numbness or tingling in her extremities at this time.  She states that since starting Triamterene her fluid retention has significantly reduced.  No falls or syncope.  She has been eating healthy and counting points on weight watchers. She is staying well hydrated and her weight is down 18 lbs over the last year which she is quite happy about.   ECOG Performance Status: 0 - Asymptomatic  Medications:  Allergies as of 07/23/2021       Reactions   Sulfa Antibiotics Hives        Medication List        Accurate as of July 23, 2021 10:23 AM. If you have any questions, ask your nurse or doctor.          acetaminophen 500 MG tablet Commonly known as: TYLENOL Take 500 mg by mouth every 6 (six) hours as needed.   aspirin 81 MG tablet Take 1 tablet (81 mg total) daily by mouth.   cetirizine 10 MG tablet Commonly known as: ZYRTEC Take 10 mg by mouth as needed for allergies.   doxycycline 40 MG capsule Commonly known as: ORACEA Take 1 capsule by mouth daily. Patient is taking 50mg    Eliquis 2.5 MG Tabs tablet Generic drug: apixaban TAKE 1 TABLET(2.5 MG) BY MOUTH TWICE DAILY   furosemide 20 MG tablet Commonly known as: LASIX Take 1 tablet (20 mg total)  by mouth daily.   NON FORMULARY Ambreen two capsules daily   pseudoephedrine-guaifenesin 60-600 MG 12 hr tablet Commonly known as: MUCINEX D Take 1 tablet by mouth every 12 (twelve) hours.   triamterene 100 MG capsule Commonly known as: DYRENIUM TAKE 1 CAPSULE(100 MG) BY MOUTH DAILY        Allergies:  Allergies  Allergen Reactions   Sulfa Antibiotics Hives    Past Medical History, Surgical history, Social history, and Family History were reviewed and updated.  Review of Systems: All other 10 point review of systems is negative.   Physical Exam:  vitals were not taken for this visit.   Wt Readings from Last 3 Encounters:  02/22/21 192 lb 12.8 oz (87.5 kg)  10/31/20 214 lb 3.2 oz (97.2 kg)  09/19/19 210 lb 3.2 oz (95.3 kg)    Ocular: Sclerae unicteric, pupils equal, round and reactive to light Ear-nose-throat: Oropharynx clear, dentition fair Lymphatic: No cervical or supraclavicular adenopathy Lungs no rales or rhonchi, good excursion bilaterally Heart regular rate and rhythm, no murmur appreciated Abd soft, nontender, positive bowel sounds MSK no focal spinal tenderness, no joint edema Neuro: non-focal, well-oriented, appropriate affect Breasts: Deferred   Lab Results  Component Value Date   WBC 6.3 10/31/2020   HGB 13.4 10/31/2020   HCT 40.7 10/31/2020  MCV 92.2 10/31/2020   PLT 226.0 10/31/2020   No results found for: FERRITIN, IRON, TIBC, UIBC, IRONPCTSAT Lab Results  Component Value Date   RBC 4.41 10/31/2020   No results found for: KPAFRELGTCHN, LAMBDASER, KAPLAMBRATIO No results found for: IGGSERUM, IGA, IGMSERUM No results found for: Georgann Housekeeper, MSPIKE, SPEI   Chemistry      Component Value Date/Time   NA 140 02/22/2021 1141   NA 145 07/20/2017 0926   NA 141 03/12/2016 0858   K 3.8 02/22/2021 1141   K 4.2 07/20/2017 0926   K 4.0 03/12/2016 0858   CL 104 02/22/2021 1141   CL 107 07/20/2017  0926   CO2 28 02/22/2021 1141   CO2 27 07/20/2017 0926   CO2 27 03/12/2016 0858   BUN 15 02/22/2021 1141   BUN 10 07/20/2017 0926   BUN 14.6 03/12/2016 0858   CREATININE 0.78 02/22/2021 1141   CREATININE 0.92 06/10/2019 0817   CREATININE 0.6 07/20/2017 0926   CREATININE 0.8 03/12/2016 0858      Component Value Date/Time   CALCIUM 9.9 02/22/2021 1141   CALCIUM 9.1 07/20/2017 0926   CALCIUM 9.5 03/12/2016 0858   ALKPHOS 68 10/31/2020 0803   ALKPHOS 77 07/20/2017 0926   ALKPHOS 92 03/12/2016 0858   AST 21 10/31/2020 0803   AST 16 06/10/2019 0817   AST 17 03/12/2016 0858   ALT 16 10/31/2020 0803   ALT 14 06/10/2019 0817   ALT 33 07/20/2017 0926   ALT 15 03/12/2016 0858   BILITOT 0.4 10/31/2020 0803   BILITOT 0.4 06/10/2019 0817   BILITOT 0.39 03/12/2016 0858       Impression and Plan: Ms. Bolls is a very pleasant 60 yo caucasian female with history of left lower extremity DVT as well as positive lupus anticoagulant.  She is tolerating Eliquis and aspirin nicely and has had no issue with recurrence.  We will plan to recheck labs in 6 months and follow-up in 1 year.  She can contact our office with any questions or concerns.   Eileen Stanford, NP 11/22/202210:23 AM

## 2021-07-25 LAB — LUPUS ANTICOAGULANT PANEL
DRVVT: 57.5 s — ABNORMAL HIGH (ref 0.0–47.0)
PTT Lupus Anticoagulant: 34.4 s (ref 0.0–51.9)

## 2021-07-25 LAB — DRVVT MIX: dRVVT Mix: 48.2 s — ABNORMAL HIGH (ref 0.0–40.4)

## 2021-07-25 LAB — DRVVT CONFIRM: dRVVT Confirm: 1.1 ratio (ref 0.8–1.2)

## 2021-08-18 ENCOUNTER — Other Ambulatory Visit: Payer: Self-pay | Admitting: Hematology & Oncology

## 2021-08-18 DIAGNOSIS — I825Z2 Chronic embolism and thrombosis of unspecified deep veins of left distal lower extremity: Secondary | ICD-10-CM

## 2021-08-23 ENCOUNTER — Other Ambulatory Visit: Payer: Self-pay | Admitting: Hematology & Oncology

## 2021-08-23 DIAGNOSIS — I825Z2 Chronic embolism and thrombosis of unspecified deep veins of left distal lower extremity: Secondary | ICD-10-CM

## 2021-08-27 ENCOUNTER — Encounter: Payer: Self-pay | Admitting: Family Medicine

## 2021-08-27 ENCOUNTER — Ambulatory Visit (INDEPENDENT_AMBULATORY_CARE_PROVIDER_SITE_OTHER): Payer: 59 | Admitting: Family Medicine

## 2021-08-27 VITALS — BP 118/82 | HR 75 | Temp 97.7°F | Resp 16 | Ht 69.0 in | Wt 182.8 lb

## 2021-08-27 DIAGNOSIS — Z0000 Encounter for general adult medical examination without abnormal findings: Secondary | ICD-10-CM | POA: Diagnosis not present

## 2021-08-27 DIAGNOSIS — Z1211 Encounter for screening for malignant neoplasm of colon: Secondary | ICD-10-CM

## 2021-08-27 DIAGNOSIS — E663 Overweight: Secondary | ICD-10-CM

## 2021-08-27 LAB — LIPID PANEL
Cholesterol: 227 mg/dL — ABNORMAL HIGH (ref 0–200)
HDL: 51.3 mg/dL (ref >39.00)
LDL Cholesterol: 156 mg/dL — ABNORMAL HIGH (ref 0–99)
NonHDL: 175.92
Total CHOL/HDL Ratio: 4
Triglycerides: 98 mg/dL (ref 0.0–149.0)
VLDL: 19.6 mg/dL (ref 0.0–40.0)

## 2021-08-27 LAB — TSH: TSH: 2.95 u[IU]/mL (ref 0.35–5.50)

## 2021-08-27 NOTE — Assessment & Plan Note (Signed)
Pt is down 35 lbs since last year!  Applauded her efforts and encouraged her to continue.  Will follow.

## 2021-08-27 NOTE — Progress Notes (Signed)
° °  Subjective:    Patient ID: Hannah Day, female    DOB: [birth year], 60 y.o.   MRN: [id number]  HPI CPE- UTD on mammo, pap, Tdap.  Due for colon cancer screening.   Health Maintenance  Topic Date Due   HIV Screening  Never done   Hepatitis C Screening  Never done   COLONOSCOPY (Pts 45-36yrs Insurance coverage will need to be confirmed)  Never done   PAP SMEAR-Modifier  10/12/2021   COVID-19 Vaccine (3 - Booster for Janssen series) 08/31/2021 (Originally 10/11/2020)   Zoster Vaccines- Shingrix (1 of 2) 11/25/2021 (Originally 08/08/2011)   INFLUENZA VACCINE  11/29/2021 (Originally 04/01/2021)   MAMMOGRAM  12/25/2021   TETANUS/TDAP  01/07/2028   Pneumococcal Vaccine 86-77 Years old  Aged Out   HPV VACCINES  Aged Out      Review of Systems Patient reports no vision/ hearing changes, adenopathy,fever, weight change,  persistant/recurrent hoarseness , swallowing issues, chest pain, palpitations, edema, persistant/recurrent cough, hemoptysis, dyspnea (rest/exertional/paroxysmal nocturnal), gastrointestinal bleeding (melena, rectal bleeding), abdominal pain, significant heartburn, bowel changes, GU symptoms (dysuria, hematuria, incontinence), Gyn symptoms (abnormal  bleeding, pain),  syncope, focal weakness, memory loss, numbness & tingling, skin/hair/nail changes, abnormal bruising or bleeding, anxiety, or depression.   This visit occurred during the SARS-CoV-2 public health emergency.  Safety protocols were in place, including screening questions prior to the visit, additional usage of staff PPE, and extensive cleaning of exam room while observing appropriate contact time as indicated for disinfecting solutions.      Objective:   Physical Exam General Appearance:    Alert, cooperative, no distress, appears stated age  Head:    Normocephalic, without obvious abnormality, atraumatic  Eyes:    PERRL, conjunctiva/corneas clear, EOM's intact, fundi    benign, both eyes  Ears:    Normal TM's  and external ear canals, both ears  Nose:   Deferred due to COVID  Throat:   Neck:   Supple, symmetrical, trachea midline, no adenopathy;    Thyroid: no enlargement/tenderness/nodules  Back:     Symmetric, no curvature, ROM normal, no CVA tenderness  Lungs:     Clear to auscultation bilaterally, respirations unlabored  Chest Wall:    No tenderness or deformity   Heart:    Regular rate and rhythm, S1 and S2 normal, no murmur, rub   or gallop  Breast Exam:    Deferred to GYN  Abdomen:     Soft, non-tender, bowel sounds active all four quadrants,    no masses, no organomegaly  Genitalia:    Deferred to GYN  Rectal:    Extremities:   Extremities normal, atraumatic, no cyanosis or edema  Pulses:   2+ and symmetric all extremities  Skin:   Skin color, texture, turgor normal, no rashes or lesions  Lymph nodes:   Cervical, supraclavicular, and axillary nodes normal  Neurologic:   CNII-XII intact, normal strength, sensation and reflexes    throughout          Assessment & Plan:

## 2021-08-27 NOTE — Assessment & Plan Note (Signed)
Pt's PE WNL.  UTD on mammo, pap, Tdap.  Order placed for cologuard.  Check labs.  Anticipatory guidance provided.

## 2021-08-27 NOTE — Patient Instructions (Signed)
Follow up in 1 year or as needed We'll notify you of your lab results and make any changes if needed Continue to work on healthy diet and regular exercise- you're doing great! Complete and return the cologuard as directed Call with any questions or concerns Stay Safe!  Stay Healthy! Happy New Years!!

## 2021-09-04 ENCOUNTER — Telehealth: Payer: Self-pay | Admitting: *Deleted

## 2021-09-04 DIAGNOSIS — E669 Obesity, unspecified: Secondary | ICD-10-CM

## 2021-09-04 MED ORDER — ROSUVASTATIN CALCIUM 10 MG PO TABS: 10.0000 mg | ORAL_TABLET | Freq: Every day | ORAL | 3 refills | Status: DC

## 2021-09-04 NOTE — Telephone Encounter (Signed)
-----   Message from Sheliah Hatch, MD sent at 09/02/2021  9:28 PM EST ----- Total cholesterol and LDL (bad cholesterol) are both higher than last check.  Based on this, I would recommend Crestor 10mg  nightly, #30, 3 refills while working on healthy diet and regular exercise.  We will recheck your liver enzymes at a lab only visit in 6-8 weeks to ensure you are metabolizing the medication appropriately.  Remainder of labs look good!

## 2021-09-04 NOTE — Telephone Encounter (Signed)
Spoke with patient and relayed lab results from Dr. Beverely Low. Medication was sent to pharmacy on file and verified by patient. Lab work ordered for liver enzyme recheck and lab visit scheduled for 10/29/2021. Patient verbalized agreement and understanding.

## 2021-09-04 NOTE — Addendum Note (Signed)
Addended by: Hildred Priest on: 09/04/2021 04:50 PM   Modules accepted: Orders

## 2021-09-20 ENCOUNTER — Telehealth: Payer: Self-pay

## 2021-09-20 LAB — COLOGUARD: COLOGUARD: NEGATIVE

## 2021-09-20 NOTE — Telephone Encounter (Signed)
-----   Message from Sheliah Hatch, MD sent at 09/20/2021  3:51 PM EST ----- Negative cologuard- great news!!

## 2021-09-23 NOTE — Telephone Encounter (Signed)
Lvm for patient advising her of her results. Asked she call back with any questions

## 2021-10-18 ENCOUNTER — Other Ambulatory Visit: Payer: Self-pay | Admitting: Family Medicine

## 2021-10-22 ENCOUNTER — Other Ambulatory Visit: Payer: 59

## 2021-10-29 ENCOUNTER — Other Ambulatory Visit (INDEPENDENT_AMBULATORY_CARE_PROVIDER_SITE_OTHER): Payer: 59

## 2021-10-29 DIAGNOSIS — E669 Obesity, unspecified: Secondary | ICD-10-CM

## 2021-10-29 LAB — HEPATIC FUNCTION PANEL
ALT: 11 U/L (ref 0–35)
AST: 16 U/L (ref 0–37)
Albumin: 4 g/dL (ref 3.5–5.2)
Alkaline Phosphatase: 68 U/L (ref 39–117)
Bilirubin, Direct: 0.1 mg/dL (ref 0.0–0.3)
Total Bilirubin: 0.5 mg/dL (ref 0.2–1.2)
Total Protein: 6.4 g/dL (ref 6.0–8.3)

## 2021-11-26 ENCOUNTER — Ambulatory Visit (INDEPENDENT_AMBULATORY_CARE_PROVIDER_SITE_OTHER): Payer: 59 | Admitting: Registered Nurse

## 2021-11-26 ENCOUNTER — Other Ambulatory Visit: Payer: Self-pay

## 2021-11-26 ENCOUNTER — Encounter: Payer: Self-pay | Admitting: Registered Nurse

## 2021-11-26 VITALS — BP 117/57 | HR 73 | Temp 97.8°F | Resp 18 | Ht 69.0 in | Wt 187.4 lb

## 2021-11-26 DIAGNOSIS — H6691 Otitis media, unspecified, right ear: Secondary | ICD-10-CM

## 2021-11-26 MED ORDER — AMOXICILLIN-POT CLAVULANATE 875-125 MG PO TABS: 1.0000 | ORAL_TABLET | Freq: Two times a day (BID) | ORAL | 0 refills | Status: DC

## 2021-11-26 MED ORDER — PREDNISONE 10 MG (21) PO TBPK: ORAL_TABLET | ORAL | 0 refills | Status: DC

## 2021-11-26 NOTE — Progress Notes (Signed)
? ?Acute Office Visit ? ?Subjective:  ? ? Patient ID: Hannah Day, female    DOB: 1960/10/09, 61 y.o.   MRN: [id number] ? ?Chief Complaint  ?Patient presents with  ? Facial Swelling  ?  Patient states she has been having some tenderness and swelling right by her ear since Saturday.  ? ? ?HPI ?Patient is in today for ear pain ? ?Onset Saturday with some tenderness. ?Some swelling anterior to R ear worse on Monday ?Stable today. Pressure in ear ?No drainage from ear or changes to hearing. ?No recent infections ?Has happened in remote past.  ?No dental pain, jaw pain, dental infections  ? ?Outpatient Medications Prior to Visit  ?Medication Sig Dispense Refill  ? acetaminophen (TYLENOL) 500 MG tablet Take 500 mg by mouth every 6 (six) hours as needed.    ? aspirin 81 MG tablet Take 1 tablet (81 mg total) daily by mouth.  3  ? doxycycline (ORACEA) 40 MG capsule Take 1 capsule by mouth daily. Patient is taking 50mg     ? ELIQUIS 2.5 MG TABS tablet TAKE 1 TABLET(2.5 MG) BY MOUTH TWICE DAILY 60 tablet 0  ? NON FORMULARY Ambreen two capsules daily    ? pseudoephedrine-guaifenesin (MUCINEX D) 60-600 MG 12 hr tablet Take 1 tablet by mouth every 12 (twelve) hours.    ? rosuvastatin (CRESTOR) 10 MG tablet Take 1 tablet (10 mg total) by mouth at bedtime. 30 tablet 3  ? triamterene (DYRENIUM) 100 MG capsule TAKE 1 CAPSULE(100 MG) BY MOUTH DAILY 30 capsule 3  ? ?No facility-administered medications prior to visit.  ? ? ?Review of Systems  ?Constitutional: Negative.   ?HENT:  Positive for ear pain and facial swelling.   ?Eyes: Negative.   ?Respiratory: Negative.    ?Cardiovascular: Negative.   ?Gastrointestinal: Negative.   ?Genitourinary: Negative.   ?Musculoskeletal: Negative.   ?Skin: Negative.   ?Neurological: Negative.   ?Psychiatric/Behavioral: Negative.    ?All other systems reviewed and are negative. ? ?   ?Objective:  ?  ?BP (!) 117/57   Pulse 73   Temp 97.8 ?F (36.6 ?C) (Temporal)   Resp 18   Ht 5\' 9"  (1.753 m)   Wt  187 lb 6.4 oz (85 kg)   LMP 02/12/2011   SpO2 99%   BMI 27.67 kg/m?  ?Physical Exam ?Vitals and nursing note reviewed.  ?Constitutional:   ?   General: She is not in acute distress. ?   Appearance: Normal appearance. She is normal weight. She is not ill-appearing, toxic-appearing or diaphoretic.  ?HENT:  ?   Head: Normocephalic and atraumatic.  ?   Right Ear: There is impacted cerumen.  ?   Left Ear: Tympanic membrane, ear canal and external ear normal. There is no impacted cerumen.  ?   Ears:  ?   Comments: R preauricular node swollen, tender, mobile.  ?   Nose: Nose normal. No congestion or rhinorrhea.  ?Neck:  ?   Comments: Trachea midline ?No evidence of peritonsillar abscess or submandibular swelling.  ?Cardiovascular:  ?   Rate and Rhythm: Normal rate and regular rhythm.  ?   Heart sounds: Normal heart sounds. No murmur heard. ?  No friction rub. No gallop.  ?Pulmonary:  ?   Effort: Pulmonary effort is normal. No respiratory distress.  ?   Breath sounds: Normal breath sounds. No stridor. No wheezing, rhonchi or rales.  ?Chest:  ?   Chest wall: No tenderness.  ?Lymphadenopathy:  ?   Cervical: No  cervical adenopathy.  ?Skin: ?   General: Skin is warm and dry.  ?Neurological:  ?   General: No focal deficit present.  ?   Mental Status: She is alert and oriented to person, place, and time. Mental status is at baseline.  ?Psychiatric:     ?   Mood and Affect: Mood normal.     ?   Behavior: Behavior normal.     ?   Thought Content: Thought content normal.     ?   Judgment: Judgment normal.  ? ? ?No results found for any visits on 11/26/21. ? ? ?   ?Assessment & Plan:  ?1. Right otitis media, unspecified otitis media type ?- amoxicillin-clavulanate (AUGMENTIN) 875-125 MG tablet; Take 1 tablet by mouth 2 (two) times daily.  Dispense: 20 tablet; Refill: 0 ?- predniSONE (STERAPRED UNI-PAK 21 TAB) 10 MG (21) TBPK tablet; Take per package instructions. Do not skip doses. Finish entire supply.  Dispense: 1 each; Refill:  0 ? ? ? ?Meds ordered this encounter  ?Medications  ? amoxicillin-clavulanate (AUGMENTIN) 875-125 MG tablet  ?  Sig: Take 1 tablet by mouth 2 (two) times daily.  ?  Dispense:  20 tablet  ?  Refill:  0  ?  Order Specific Question:   Supervising Provider  ?  Answer:   Neva Seat, JEFFREY R [[id number]]  ? predniSONE (STERAPRED UNI-PAK 21 TAB) 10 MG (21) TBPK tablet  ?  Sig: Take per package instructions. Do not skip doses. Finish entire supply.  ?  Dispense:  1 each  ?  Refill:  0  ?  Order Specific Question:   Supervising Provider  ?  Answer:   Neva Seat, JEFFREY R [[id number]]  ? ? ?Return if symptoms worsen or fail to improve. ? ?PLAN ?Appears as infection. Perhaps otitis. Ear blocked by tm impaction. Will delay lavage dt pain ?Augmentin po bid x 10 days. Prednisone taper as above ?Low threshold for return or imaging if worsening or failing to improve. ?Patient encouraged to call clinic with any questions, comments, or concerns. ? ?Janeece Agee, NP ?

## 2021-11-26 NOTE — Patient Instructions (Addendum)
Hannah Day -  ? ?Looks like a bit of an infection. ? ?Augmentin - finish course even if feeling better. ?Prednisone - taper unless poorly tolerated ? ?Call if worsening by Friday morning ? ?Thank you! ? ?Rich  ? ? ? ?If you have lab work done today you will be contacted with your lab results within the next 2 weeks.  If you have not heard from [place] then please contact us. The fastest way to get your results is to register for My Chart. ? ? ?IF you received an x-ray today, you will receive an invoice from [facility]. Please contact [facility] at [phone] with questions or concerns regarding your invoice.  ? ?IF you received labwork today, you will receive an invoice from [place]. Please contact [facility] at [phone] with questions or concerns regarding your invoice.  ? ?Our billing staff will not be able to assist you with questions regarding bills from these companies. ? ?You will be contacted with the lab results as soon as they are available. The fastest way to get your results is to activate your My Chart account. Instructions are located on the last page of this paperwork. If you have not heard from [place] regarding the results in 2 weeks, please contact this office. ?  ? ? ?

## 2021-12-20 ENCOUNTER — Other Ambulatory Visit: Payer: Self-pay

## 2021-12-20 DIAGNOSIS — Z0000 Encounter for general adult medical examination without abnormal findings: Secondary | ICD-10-CM

## 2021-12-20 DIAGNOSIS — E663 Overweight: Secondary | ICD-10-CM

## 2021-12-20 MED ORDER — ROSUVASTATIN CALCIUM 10 MG PO TABS: 10.0000 mg | ORAL_TABLET | Freq: Every day | ORAL | 3 refills | Status: DC

## 2021-12-27 ENCOUNTER — Ambulatory Visit (INDEPENDENT_AMBULATORY_CARE_PROVIDER_SITE_OTHER): Payer: 59 | Admitting: Family Medicine

## 2021-12-27 ENCOUNTER — Other Ambulatory Visit: Payer: Self-pay

## 2021-12-27 ENCOUNTER — Encounter: Payer: Self-pay | Admitting: Family Medicine

## 2021-12-27 VITALS — BP 128/88 | HR 72 | Temp 98.2°F | Resp 18 | Ht 69.0 in | Wt 189.0 lb

## 2021-12-27 DIAGNOSIS — M549 Dorsalgia, unspecified: Secondary | ICD-10-CM

## 2021-12-27 DIAGNOSIS — R300 Dysuria: Secondary | ICD-10-CM

## 2021-12-27 LAB — POCT URINALYSIS DIP (MANUAL ENTRY)
Bilirubin, UA: NEGATIVE
Glucose, UA: NEGATIVE mg/dL
Ketones, POC UA: NEGATIVE mg/dL
Nitrite, UA: NEGATIVE
Protein Ur, POC: NEGATIVE mg/dL
Spec Grav, UA: 1.01 (ref 1.010–1.025)
Urobilinogen, UA: NEGATIVE E.U./dL — AB
pH, UA: 7 (ref 5.0–8.0)

## 2021-12-27 MED ORDER — CEPHALEXIN 500 MG PO CAPS: 500.0000 mg | ORAL_CAPSULE | Freq: Two times a day (BID) | ORAL | 0 refills | Status: AC

## 2021-12-27 NOTE — Progress Notes (Signed)
? ?  Subjective:  ? ? Patient ID: Hannah Day, female    DOB: 12/05/1960, 61 y.o.   MRN: [id number] ? ?HPI ?Burning w/ urination- sxs started 2 days ago w/ L low back pain.  Yesterday noticed burning w/ urination.  No visible blood.  Increased frequency but little output.  + urgency. ? ? ?Review of Systems ?For ROS see HPI  ?   ?Objective:  ? Physical Exam ?Vitals reviewed.  ?Constitutional:   ?   General: She is not in acute distress. ?   Appearance: Normal appearance. She is well-developed. She is not ill-appearing.  ?Abdominal:  ?   General: There is no distension.  ?   Palpations: Abdomen is soft.  ?   Tenderness: There is no abdominal tenderness (no suprapubic or CVA tenderness).  ?Musculoskeletal:  ?   Right lower leg: No edema.  ?   Left lower leg: No edema.  ?Skin: ?   General: Skin is warm and dry.  ?Neurological:  ?   General: No focal deficit present.  ?   Mental Status: She is alert and oriented to person, place, and time.  ?Psychiatric:     ?   Mood and Affect: Mood normal.     ?   Behavior: Behavior normal.  ? ? ? ? ? ?   ?Assessment & Plan:  ? ?Burning w/ urination- new.  Pt's sxs and UA consistent w/ infxn.  Start Keflex BID x5 days and send urine for culture.  Adjust abx prn.  Reviewed supportive care and red flags that should prompt return.  Pt expressed understanding and is in agreement w/ plan.  ?

## 2021-12-27 NOTE — Patient Instructions (Addendum)
Follow up as needed or as scheduled ?START the Cephalexin twice daily ?Drink LOTS of water ?Call with any questions or concerns ?Stay Safe!  Stay Healthy!!! ?Have a great weekend!!! ?

## 2021-12-27 NOTE — Addendum Note (Signed)
Addended by: Veneda Melter on: 12/27/2021 03:34 PM ? ? Modules accepted: Orders ? ?

## 2021-12-29 LAB — URINE CULTURE
MICRO NUMBER:: 13326679
SPECIMEN QUALITY:: ADEQUATE

## 2022-01-21 ENCOUNTER — Inpatient Hospital Stay: Payer: 59 | Attending: Hematology & Oncology

## 2022-01-21 DIAGNOSIS — D6862 Lupus anticoagulant syndrome: Secondary | ICD-10-CM | POA: Diagnosis present

## 2022-01-21 DIAGNOSIS — Z7901 Long term (current) use of anticoagulants: Secondary | ICD-10-CM | POA: Insufficient documentation

## 2022-01-21 DIAGNOSIS — I82409 Acute embolism and thrombosis of unspecified deep veins of unspecified lower extremity: Secondary | ICD-10-CM

## 2022-01-21 DIAGNOSIS — Z86718 Personal history of other venous thrombosis and embolism: Secondary | ICD-10-CM | POA: Diagnosis not present

## 2022-01-21 LAB — CMP (CANCER CENTER ONLY)
ALT: 14 U/L (ref 0–44)
AST: 19 U/L (ref 15–41)
Albumin: 4.4 g/dL (ref 3.5–5.0)
Alkaline Phosphatase: 73 U/L (ref 38–126)
Anion gap: 6 (ref 5–15)
BUN: 20 mg/dL (ref 6–20)
CO2: 30 mmol/L (ref 22–32)
Calcium: 10 mg/dL (ref 8.9–10.3)
Chloride: 106 mmol/L (ref 98–111)
Creatinine: 0.96 mg/dL (ref 0.44–1.00)
GFR, Estimated: >60 mL/min (ref >60)
Glucose, Bld: 92 mg/dL (ref 70–99)
Potassium: 4.5 mmol/L (ref 3.5–5.1)
Sodium: 142 mmol/L (ref 135–145)
Total Bilirubin: 0.4 mg/dL (ref 0.3–1.2)
Total Protein: 7.1 g/dL (ref 6.5–8.1)

## 2022-01-21 LAB — CBC WITH DIFFERENTIAL (CANCER CENTER ONLY)
Abs Immature Granulocytes: 0.01 10*3/uL (ref 0.00–0.07)
Basophils Absolute: 0 10*3/uL (ref 0.0–0.1)
Basophils Relative: 1 %
Eosinophils Absolute: 0.2 10*3/uL (ref 0.0–0.5)
Eosinophils Relative: 3 %
HCT: 41.9 % (ref 36.0–46.0)
Hemoglobin: 13.4 g/dL (ref 12.0–15.0)
Immature Granulocytes: 0 %
Lymphocytes Relative: 43 %
Lymphs Abs: 2.5 10*3/uL (ref 0.7–4.0)
MCH: 30.8 pg (ref 26.0–34.0)
MCHC: 32 g/dL (ref 30.0–36.0)
MCV: 96.3 fL (ref 80.0–100.0)
Monocytes Absolute: 0.4 10*3/uL (ref 0.1–1.0)
Monocytes Relative: 7 %
Neutro Abs: 2.7 10*3/uL (ref 1.7–7.7)
Neutrophils Relative %: 46 %
Platelet Count: 194 10*3/uL (ref 150–400)
RBC: 4.35 MIL/uL (ref 3.87–5.11)
RDW: 12.7 % (ref 11.5–15.5)
WBC Count: 5.7 10*3/uL (ref 4.0–10.5)
nRBC: 0 % (ref 0.0–0.2)

## 2022-01-21 LAB — D-DIMER, QUANTITATIVE: D-Dimer, Quant: 0.32 ug/mL-FEU (ref 0.00–0.50)

## 2022-01-22 LAB — LUPUS ANTICOAGULANT PANEL
DRVVT: 49.9 s — ABNORMAL HIGH (ref 0.0–47.0)
PTT Lupus Anticoagulant: 34.5 s (ref 0.0–43.5)

## 2022-01-22 LAB — DRVVT MIX: dRVVT Mix: 45.1 s — ABNORMAL HIGH (ref 0.0–40.4)

## 2022-01-22 LAB — DRVVT CONFIRM: dRVVT Confirm: 1 ratio (ref 0.8–1.2)

## 2022-02-10 ENCOUNTER — Other Ambulatory Visit: Payer: Self-pay

## 2022-02-10 DIAGNOSIS — M549 Dorsalgia, unspecified: Secondary | ICD-10-CM

## 2022-02-10 MED ORDER — TRIAMTERENE 100 MG PO CAPS: ORAL_CAPSULE | ORAL | 3 refills | Status: DC

## 2022-03-12 ENCOUNTER — Other Ambulatory Visit: Payer: Self-pay | Admitting: Family

## 2022-03-12 DIAGNOSIS — I825Z2 Chronic embolism and thrombosis of unspecified deep veins of left distal lower extremity: Secondary | ICD-10-CM

## 2022-04-11 ENCOUNTER — Other Ambulatory Visit: Payer: Self-pay

## 2022-04-11 ENCOUNTER — Other Ambulatory Visit: Payer: Self-pay | Admitting: Hematology & Oncology

## 2022-04-11 DIAGNOSIS — E663 Overweight: Secondary | ICD-10-CM

## 2022-04-11 DIAGNOSIS — I825Z2 Chronic embolism and thrombosis of unspecified deep veins of left distal lower extremity: Secondary | ICD-10-CM

## 2022-04-11 DIAGNOSIS — Z0000 Encounter for general adult medical examination without abnormal findings: Secondary | ICD-10-CM

## 2022-04-11 MED ORDER — ROSUVASTATIN CALCIUM 10 MG PO TABS: 10.0000 mg | ORAL_TABLET | Freq: Every day | ORAL | 3 refills | Status: DC

## 2022-05-11 ENCOUNTER — Other Ambulatory Visit: Payer: Self-pay | Admitting: Hematology & Oncology

## 2022-05-11 DIAGNOSIS — I825Z2 Chronic embolism and thrombosis of unspecified deep veins of left distal lower extremity: Secondary | ICD-10-CM

## 2022-06-10 ENCOUNTER — Other Ambulatory Visit: Payer: Self-pay

## 2022-06-10 ENCOUNTER — Telehealth: Payer: Self-pay

## 2022-06-10 ENCOUNTER — Other Ambulatory Visit: Payer: Self-pay | Admitting: Hematology & Oncology

## 2022-06-10 DIAGNOSIS — I825Z2 Chronic embolism and thrombosis of unspecified deep veins of left distal lower extremity: Secondary | ICD-10-CM

## 2022-06-10 DIAGNOSIS — M549 Dorsalgia, unspecified: Secondary | ICD-10-CM

## 2022-06-10 NOTE — Telephone Encounter (Signed)
Triamterene 100 mg LOV: 12/27/21 Last Refill:01/30/22 Upcoming appt: none

## 2022-06-11 MED ORDER — TRIAMTERENE 100 MG PO CAPS: ORAL_CAPSULE | ORAL | 3 refills | Status: DC

## 2022-06-11 NOTE — Telephone Encounter (Signed)
Called pt no answe LM letting her know "prescription has been sent please contact your pharmacy for further details"

## 2022-06-11 NOTE — Telephone Encounter (Signed)
Prescription sent

## 2022-07-10 ENCOUNTER — Other Ambulatory Visit: Payer: Self-pay | Admitting: Hematology & Oncology

## 2022-07-10 DIAGNOSIS — I825Z2 Chronic embolism and thrombosis of unspecified deep veins of left distal lower extremity: Secondary | ICD-10-CM

## 2022-07-22 ENCOUNTER — Inpatient Hospital Stay: Payer: 59 | Attending: Hematology & Oncology

## 2022-07-22 ENCOUNTER — Encounter: Payer: Self-pay | Admitting: Family

## 2022-07-22 ENCOUNTER — Inpatient Hospital Stay (HOSPITAL_BASED_OUTPATIENT_CLINIC_OR_DEPARTMENT_OTHER): Payer: 59 | Admitting: Family

## 2022-07-22 VITALS — BP 132/74 | HR 56 | Temp 98.7°F | Resp 17 | Wt 189.0 lb

## 2022-07-22 DIAGNOSIS — D6862 Lupus anticoagulant syndrome: Secondary | ICD-10-CM | POA: Diagnosis present

## 2022-07-22 DIAGNOSIS — D6859 Other primary thrombophilia: Secondary | ICD-10-CM

## 2022-07-22 DIAGNOSIS — I82409 Acute embolism and thrombosis of unspecified deep veins of unspecified lower extremity: Secondary | ICD-10-CM

## 2022-07-22 DIAGNOSIS — Z7901 Long term (current) use of anticoagulants: Secondary | ICD-10-CM | POA: Diagnosis not present

## 2022-07-22 DIAGNOSIS — Z86718 Personal history of other venous thrombosis and embolism: Secondary | ICD-10-CM | POA: Diagnosis not present

## 2022-07-22 LAB — CBC WITH DIFFERENTIAL (CANCER CENTER ONLY)
Abs Immature Granulocytes: 0.01 10*3/uL (ref 0.00–0.07)
Basophils Absolute: 0.1 10*3/uL (ref 0.0–0.1)
Basophils Relative: 1 %
Eosinophils Absolute: 0.1 10*3/uL (ref 0.0–0.5)
Eosinophils Relative: 2 %
HCT: 40.6 % (ref 36.0–46.0)
Hemoglobin: 13 g/dL (ref 12.0–15.0)
Immature Granulocytes: 0 %
Lymphocytes Relative: 39 %
Lymphs Abs: 2.5 10*3/uL (ref 0.7–4.0)
MCH: 31 pg (ref 26.0–34.0)
MCHC: 32 g/dL (ref 30.0–36.0)
MCV: 96.9 fL (ref 80.0–100.0)
Monocytes Absolute: 0.5 10*3/uL (ref 0.1–1.0)
Monocytes Relative: 7 %
Neutro Abs: 3.3 10*3/uL (ref 1.7–7.7)
Neutrophils Relative %: 51 %
Platelet Count: 198 10*3/uL (ref 150–400)
RBC: 4.19 MIL/uL (ref 3.87–5.11)
RDW: 12.7 % (ref 11.5–15.5)
WBC Count: 6.4 10*3/uL (ref 4.0–10.5)
nRBC: 0 % (ref 0.0–0.2)

## 2022-07-22 LAB — CMP (CANCER CENTER ONLY)
ALT: 14 U/L (ref 0–44)
AST: 19 U/L (ref 15–41)
Albumin: 4.3 g/dL (ref 3.5–5.0)
Alkaline Phosphatase: 67 U/L (ref 38–126)
Anion gap: 6 (ref 5–15)
BUN: 17 mg/dL (ref 6–20)
CO2: 29 mmol/L (ref 22–32)
Calcium: 9.8 mg/dL (ref 8.9–10.3)
Chloride: 107 mmol/L (ref 98–111)
Creatinine: 1.12 mg/dL — ABNORMAL HIGH (ref 0.44–1.00)
GFR, Estimated: 56 mL/min — ABNORMAL LOW (ref >60)
Glucose, Bld: 100 mg/dL — ABNORMAL HIGH (ref 70–99)
Potassium: 4.7 mmol/L (ref 3.5–5.1)
Sodium: 142 mmol/L (ref 135–145)
Total Bilirubin: 0.6 mg/dL (ref 0.3–1.2)
Total Protein: 6.9 g/dL (ref 6.5–8.1)

## 2022-07-22 LAB — D-DIMER, QUANTITATIVE: D-Dimer, Quant: <0.27 ug/mL-FEU (ref 0.00–0.50)

## 2022-07-22 NOTE — Progress Notes (Signed)
Hematology and Oncology Follow Up Visit  Hannah Day [id number] November 19, 1960 60 y.o. 07/22/2022   Principle Diagnosis:  DVT of the left lower leg - resolved Lupus anticoagulant positive   Current Therapy:        Eliquis 2.5 mg by mouth BID - restart on 07/20/2017  Aspirin 81 mg by mouth daily - start in July 2018   Interim History:  Hannah Day is here today for follow-up. She is doing quite well and has no complaints at this time.  She has tolerated maintenance Eliquis and aspirin nicely. No issue with bleeding, bruising or petechiae.  No fever, chills, n/v, cough, rash, dizziness, SOB, chest pain, palpitations, abdominal pain or changes in bowel or bladder habits.  She states that since starting the Triamterene the swelling in her legs has almost completely resolved.  No tenderness, numbness or tingling in her extremities.  No falls or syncope.  Appetite and hydration are good. Weight is stable at 189 lbs.   ECOG Performance Status: 0 - Asymptomatic  Medications:  Allergies as of 07/22/2022       Reactions   Sulfa Antibiotics Hives        Medication List        Accurate as of July 22, 2022  8:54 AM. If you have any questions, ask your nurse or doctor.          acetaminophen 500 MG tablet Commonly known as: TYLENOL Take 500 mg by mouth every 6 (six) hours as needed.   aspirin 81 MG tablet Take 1 tablet (81 mg total) daily by mouth.   doxycycline 40 MG capsule Commonly known as: ORACEA Take 1 capsule by mouth daily. Patient is taking 50mg    Eliquis 2.5 MG Tabs tablet Generic drug: apixaban TAKE 1 TABLET BY MOUTH TWICE DAILY   NON FORMULARY Ambreen two capsules daily   predniSONE 10 MG (21) Tbpk tablet Commonly known as: STERAPRED UNI-PAK 21 TAB Take per package instructions. Do not skip doses. Finish entire supply.   pseudoephedrine-guaifenesin 60-600 MG 12 hr tablet Commonly known as: MUCINEX D Take 1 tablet by mouth every 12 (twelve)  hours.   rosuvastatin 10 MG tablet Commonly known as: Crestor Take 1 tablet (10 mg total) by mouth at bedtime.   triamterene 100 MG capsule Commonly known as: DYRENIUM TAKE 1 CAPSULE(100 MG) BY MOUTH DAILY        Allergies:  Allergies  Allergen Reactions   Sulfa Antibiotics Hives    Past Medical History, Surgical history, Social history, and Family History were reviewed and updated.  Review of Systems: All other 10 point review of systems is negative.   Physical Exam:  weight is 189 lb (85.7 kg). Her oral temperature is 98.7 F (37.1 C). Her blood pressure is 132/74 and her pulse is 56 (abnormal). Her respiration is 17 and oxygen saturation is 100%.   Wt Readings from Last 3 Encounters:  07/22/22 189 lb (85.7 kg)  12/27/21 189 lb (85.7 kg)  11/26/21 187 lb 6.4 oz (85 kg)    Ocular: Sclerae unicteric, pupils equal, round and reactive to light Ear-nose-throat: Oropharynx clear, dentition fair Lymphatic: No cervical or supraclavicular adenopathy Lungs no rales or rhonchi, good excursion bilaterally Heart regular rate and rhythm, no murmur appreciated Abd soft, nontender, positive bowel sounds MSK no focal spinal tenderness, no joint edema Neuro: non-focal, well-oriented, appropriate affect Breasts: Deferred   Lab Results  Component Value Date   WBC 6.4 07/22/2022   HGB 13.0 07/22/2022  HCT 40.6 07/22/2022   MCV 96.9 07/22/2022   PLT 198 07/22/2022   No results found for: "FERRITIN", "IRON", "TIBC", "UIBC", "IRONPCTSAT" Lab Results  Component Value Date   RBC 4.19 07/22/2022   No results found for: "KPAFRELGTCHN", "LAMBDASER", "KAPLAMBRATIO" No results found for: "IGGSERUM", "IGA", "IGMSERUM" No results found for: "TOTALPROTELP", "ALBUMINELP", "A1GS", "A2GS", "BETS", "BETA2SER", "GAMS", "MSPIKE", "SPEI"   Chemistry      Component Value Date/Time   NA 142 07/22/2022 0801   NA 145 07/20/2017 0926   NA 141 03/12/2016 0858   K 4.7 07/22/2022 0801   K 4.2  07/20/2017 0926   K 4.0 03/12/2016 0858   CL 107 07/22/2022 0801   CL 107 07/20/2017 0926   CO2 29 07/22/2022 0801   CO2 27 07/20/2017 0926   CO2 27 03/12/2016 0858   BUN 17 07/22/2022 0801   BUN 10 07/20/2017 0926   BUN 14.6 03/12/2016 0858   CREATININE 1.12 (H) 07/22/2022 0801   CREATININE 0.6 07/20/2017 0926   CREATININE 0.8 03/12/2016 0858      Component Value Date/Time   CALCIUM 9.8 07/22/2022 0801   CALCIUM 9.1 07/20/2017 0926   CALCIUM 9.5 03/12/2016 0858   ALKPHOS 67 07/22/2022 0801   ALKPHOS 77 07/20/2017 0926   ALKPHOS 92 03/12/2016 0858   AST 19 07/22/2022 0801   AST 17 03/12/2016 0858   ALT 14 07/22/2022 0801   ALT 33 07/20/2017 0926   ALT 15 03/12/2016 0858   BILITOT 0.6 07/22/2022 0801   BILITOT 0.39 03/12/2016 0858       Impression and Plan: Hannah Day is a very pleasant 61 yo caucasian female with history of left lower extremity DVT as well as positive lupus anticoagulant.  She continues to tolerate Eliquis and aspirin nicely. So far she has had no issue with recurrence.  We will plan to recheck labs in 6 months and follow-up in 1 year.   Eileen Stanford, NP 11/21/20238:54 AM

## 2022-07-23 LAB — LUPUS ANTICOAGULANT PANEL
DRVVT: 41.9 s (ref 0.0–47.0)
PTT Lupus Anticoagulant: 30.5 s (ref 0.0–43.5)

## 2022-08-09 ENCOUNTER — Other Ambulatory Visit: Payer: Self-pay | Admitting: Hematology & Oncology

## 2022-08-09 DIAGNOSIS — I825Z2 Chronic embolism and thrombosis of unspecified deep veins of left distal lower extremity: Secondary | ICD-10-CM

## 2022-08-11 ENCOUNTER — Other Ambulatory Visit: Payer: Self-pay

## 2022-08-11 DIAGNOSIS — Z0000 Encounter for general adult medical examination without abnormal findings: Secondary | ICD-10-CM

## 2022-08-11 DIAGNOSIS — E663 Overweight: Secondary | ICD-10-CM

## 2022-08-11 MED ORDER — ROSUVASTATIN CALCIUM 10 MG PO TABS: 10.0000 mg | ORAL_TABLET | Freq: Every day | ORAL | 3 refills | Status: DC

## 2022-08-28 ENCOUNTER — Encounter: Payer: 59 | Admitting: Family Medicine

## 2022-09-08 ENCOUNTER — Other Ambulatory Visit: Payer: Self-pay | Admitting: Hematology & Oncology

## 2022-09-08 DIAGNOSIS — I825Z2 Chronic embolism and thrombosis of unspecified deep veins of left distal lower extremity: Secondary | ICD-10-CM

## 2022-10-01 ENCOUNTER — Telehealth: Payer: Self-pay | Admitting: Family Medicine

## 2022-10-01 NOTE — Telephone Encounter (Signed)
Caller name: ALINE WESCHE  On DPR?: Yes  Call back number: [phone] (mobile)  Provider they see: Midge Minium, MD  Reason for call: Pt called statiing she thinks she has pink eye -she's asking for medical advice

## 2022-10-01 NOTE — Telephone Encounter (Signed)
Spoke to the pt states her eyes are burning and red itchy, matted shut . I advised she needs an apt to be seen .

## 2022-10-02 ENCOUNTER — Ambulatory Visit (INDEPENDENT_AMBULATORY_CARE_PROVIDER_SITE_OTHER): Payer: 59 | Admitting: Family Medicine

## 2022-10-02 ENCOUNTER — Encounter: Payer: Self-pay | Admitting: Family Medicine

## 2022-10-02 VITALS — BP 128/80 | HR 83 | Temp 97.7°F | Resp 17 | Ht 69.0 in | Wt 196.4 lb

## 2022-10-02 DIAGNOSIS — H1033 Unspecified acute conjunctivitis, bilateral: Secondary | ICD-10-CM

## 2022-10-02 MED ORDER — POLYMYXIN B-TRIMETHOPRIM 10000-0.1 UNIT/ML-% OP SOLN: 2.0000 [drp] | Freq: Three times a day (TID) | OPHTHALMIC | 0 refills | Status: DC

## 2022-10-02 NOTE — Progress Notes (Signed)
   Subjective:    Patient ID: Hannah Day, female    DOB: 05/11/1961, 62 y.o.   MRN: [id number]  HPI L eye pain- pt was traveling last week and initially thought eye was just dry and irritated.  Then Tuesday noticed drainage, redness, feeling gritty.  Some mild sxs in R.  No known injury.  No visual changes.  No known sick contacts.   Review of Systems For ROS see HPI     Objective:   Physical Exam Vitals reviewed.  Constitutional:      Appearance: Normal appearance.  HENT:     Head: Normocephalic and atraumatic.  Eyes:     Extraocular Movements: Extraocular movements intact.     Pupils: Pupils are equal, round, and reactive to light.     Comments: Conjunctival injection w/ limbic sparing  Lymphadenopathy:     Cervical: No cervical adenopathy.  Skin:    General: Skin is warm and dry.  Neurological:     General: No focal deficit present.     Mental Status: She is alert and oriented to person, place, and time.  Psychiatric:        Mood and Affect: Mood normal.        Behavior: Behavior normal.        Thought Content: Thought content normal.           Assessment & Plan:  Conjunctivitis- new.  L>R.  Sxs and PE consistent w/ dx.  Start abx eye drops.  Reviewed supportive care and red flags that should prompt return.  Pt expressed understanding and is in agreement w/ plan.

## 2022-10-02 NOTE — Patient Instructions (Signed)
Follow up as needed or as scheduled START the eye drops 3x/day as directed Try not to rub eyes Call with any questions or concerns Hang in there!!!

## 2022-10-08 ENCOUNTER — Other Ambulatory Visit: Payer: Self-pay | Admitting: Family Medicine

## 2022-10-08 ENCOUNTER — Other Ambulatory Visit: Payer: Self-pay | Admitting: Hematology & Oncology

## 2022-10-08 DIAGNOSIS — I825Z2 Chronic embolism and thrombosis of unspecified deep veins of left distal lower extremity: Secondary | ICD-10-CM

## 2022-10-08 DIAGNOSIS — M549 Dorsalgia, unspecified: Secondary | ICD-10-CM

## 2022-10-15 ENCOUNTER — Encounter: Payer: Self-pay | Admitting: Family Medicine

## 2022-10-15 ENCOUNTER — Ambulatory Visit (INDEPENDENT_AMBULATORY_CARE_PROVIDER_SITE_OTHER): Payer: 59 | Admitting: Family Medicine

## 2022-10-15 VITALS — BP 128/78 | HR 74 | Temp 97.9°F | Resp 17 | Ht 69.0 in | Wt 198.5 lb

## 2022-10-15 DIAGNOSIS — E663 Overweight: Secondary | ICD-10-CM

## 2022-10-15 DIAGNOSIS — Z0000 Encounter for general adult medical examination without abnormal findings: Secondary | ICD-10-CM

## 2022-10-15 DIAGNOSIS — E785 Hyperlipidemia, unspecified: Secondary | ICD-10-CM | POA: Diagnosis not present

## 2022-10-15 LAB — CBC WITH DIFFERENTIAL/PLATELET
Basophils Absolute: 0.1 10*3/uL (ref 0.0–0.1)
Basophils Relative: 1.2 % (ref 0.0–3.0)
Eosinophils Absolute: 0.2 10*3/uL (ref 0.0–0.7)
Eosinophils Relative: 2.6 % (ref 0.0–5.0)
HCT: 38.8 % (ref 36.0–46.0)
Hemoglobin: 13 g/dL (ref 12.0–15.0)
Lymphocytes Relative: 38.1 % (ref 12.0–46.0)
Lymphs Abs: 2.6 10*3/uL (ref 0.7–4.0)
MCHC: 33.4 g/dL (ref 30.0–36.0)
MCV: 94 fl (ref 78.0–100.0)
Monocytes Absolute: 0.5 10*3/uL (ref 0.1–1.0)
Monocytes Relative: 7.5 % (ref 3.0–12.0)
Neutro Abs: 3.5 10*3/uL (ref 1.4–7.7)
Neutrophils Relative %: 50.6 % (ref 43.0–77.0)
Platelets: 214 10*3/uL (ref 150.0–400.0)
RBC: 4.13 Mil/uL (ref 3.87–5.11)
RDW: 13.4 % (ref 11.5–15.5)
WBC: 6.9 10*3/uL (ref 4.0–10.5)

## 2022-10-15 LAB — BASIC METABOLIC PANEL
BUN: 16 mg/dL (ref 6–23)
CO2: 28 mEq/L (ref 19–32)
Calcium: 9.6 mg/dL (ref 8.4–10.5)
Chloride: 106 mEq/L (ref 96–112)
Creatinine, Ser: 0.97 mg/dL (ref 0.40–1.20)
GFR: 63.21 mL/min (ref >60.00)
Glucose, Bld: 80 mg/dL (ref 70–99)
Potassium: 5 mEq/L (ref 3.5–5.1)
Sodium: 139 mEq/L (ref 135–145)

## 2022-10-15 LAB — LIPID PANEL
Cholesterol: 138 mg/dL (ref 0–200)
HDL: 57.4 mg/dL (ref >39.00)
LDL Cholesterol: 67 mg/dL (ref 0–99)
NonHDL: 80.77
Total CHOL/HDL Ratio: 2
Triglycerides: 67 mg/dL (ref 0.0–149.0)
VLDL: 13.4 mg/dL (ref 0.0–40.0)

## 2022-10-15 LAB — HEPATIC FUNCTION PANEL
ALT: 15 U/L (ref 0–35)
AST: 21 U/L (ref 0–37)
Albumin: 4 g/dL (ref 3.5–5.2)
Alkaline Phosphatase: 66 U/L (ref 39–117)
Bilirubin, Direct: 0.1 mg/dL (ref 0.0–0.3)
Total Bilirubin: 0.4 mg/dL (ref 0.2–1.2)
Total Protein: 7 g/dL (ref 6.0–8.3)

## 2022-10-15 LAB — TSH: TSH: 3.1 u[IU]/mL (ref 0.35–5.50)

## 2022-10-15 NOTE — Patient Instructions (Addendum)
Follow up in 6 months to recheck cholesterol We'll notify you of your lab results and make any changes if needed Keep up the good work on healthy diet and regular exercise- you look great!! CALL AND SCHEDULE YOUR MAMMOGRAM!! Call with any questions or concerns Stay Safe!  Stay Healthy! Happy Valentine's Day!!!

## 2022-10-15 NOTE — Assessment & Plan Note (Signed)
Pt started Crestor at last lipid check.  Tolerating w/o difficulty.  Excited to see improvement.

## 2022-10-15 NOTE — Assessment & Plan Note (Signed)
Pt's PE WNL w/ exception of BMI.  UTD on pap, cologuard, Tdap, flu, shingles.  Due for mammo- pt to schedule.  Check labs.  Anticipatory guidance provided.

## 2022-10-15 NOTE — Progress Notes (Signed)
   Subjective:    Patient ID: Hannah Day, female    DOB: Jan 07, 1961, 62 y.o.   MRN: [id number]  HPI CPE- UTD on cologuard, pap.  Due for mammo.  UTD on flu, shingles, Tdap  Patient Care Team    Relationship Specialty Notifications Start End  Midge Minium, MD PCP - General Family Medicine  09/21/15     Health Maintenance  Topic Date Due   MAMMOGRAM  12/25/2021   INFLUENZA VACCINE  11/30/2022 (Originally 04/01/2022)   Zoster Vaccines- Shingrix (1 of 2) 12/31/2022 (Originally 08/08/2011)   PAP SMEAR-Modifier  10/13/2023   Fecal DNA (Cologuard)  09/13/2024   DTaP/Tdap/Td (2 - Td or Tdap) 01/07/2028   HPV VACCINES  Aged Out   COVID-19 Vaccine  Discontinued   Hepatitis C Screening  Discontinued   HIV Screening  Discontinued     Review of Systems Patient reports no vision/ hearing changes, adenopathy,fever, weight change,  persistant/recurrent hoarseness , swallowing issues, chest pain, palpitations, edema, persistant/recurrent cough, hemoptysis, dyspnea (rest/exertional/paroxysmal nocturnal), gastrointestinal bleeding (melena, rectal bleeding), abdominal pain, significant heartburn, bowel changes, GU symptoms (dysuria, hematuria, incontinence), Gyn symptoms (abnormal  bleeding, pain),  syncope, focal weakness, memory loss, numbness & tingling, skin/hair/nail changes, abnormal bruising or bleeding, anxiety, or depression.     Objective:   Physical Exam General Appearance:    Alert, cooperative, no distress, appears stated age  Head:    Normocephalic, without obvious abnormality, atraumatic  Eyes:    PERRL, conjunctiva/corneas clear, EOM's intact both eyes  Ears:    Normal TM's and external ear canals, both ears  Nose:   Nares normal, septum midline, mucosa normal, no drainage    or sinus tenderness  Throat:   Lips, mucosa, and tongue normal; teeth and gums normal  Neck:   Supple, symmetrical, trachea midline, no adenopathy;    Thyroid: no enlargement/tenderness/nodules  Back:      Symmetric, no curvature, ROM normal, no CVA tenderness  Lungs:     Clear to auscultation bilaterally, respirations unlabored  Chest Wall:    No tenderness or deformity   Heart:    Regular rate and rhythm, S1 and S2 normal, no murmur, rub   or gallop  Breast Exam:    Deferred to mammo  Abdomen:     Soft, non-tender, bowel sounds active all four quadrants,    no masses, no organomegaly  Genitalia:    Deferred  Rectal:    Extremities:   Extremities normal, atraumatic, no cyanosis or edema  Pulses:   2+ and symmetric all extremities  Skin:   Skin color, texture, turgor normal, no rashes or lesions  Lymph nodes:   Cervical, supraclavicular, and axillary nodes normal  Neurologic:   CNII-XII intact, normal strength, sensation and reflexes    throughout          Assessment & Plan:

## 2022-10-16 ENCOUNTER — Telehealth: Payer: Self-pay

## 2022-10-16 NOTE — Telephone Encounter (Signed)
Informed pt of lab results  

## 2022-10-16 NOTE — Telephone Encounter (Signed)
-----   Message from Midge Minium, MD sent at 10/16/2022  7:40 AM EST ----- YESSSSSS!!!!  Your cholesterol looks AMAZING!!!  And so does everything else!  No changes at this time

## 2022-10-27 ENCOUNTER — Encounter: Payer: Self-pay | Admitting: Family Medicine

## 2022-10-27 ENCOUNTER — Ambulatory Visit (INDEPENDENT_AMBULATORY_CARE_PROVIDER_SITE_OTHER): Payer: 59 | Admitting: Family Medicine

## 2022-10-27 VITALS — BP 112/62 | HR 58 | Temp 97.8°F | Resp 16 | Ht 69.0 in | Wt 194.5 lb

## 2022-10-27 DIAGNOSIS — H66002 Acute suppurative otitis media without spontaneous rupture of ear drum, left ear: Secondary | ICD-10-CM | POA: Diagnosis not present

## 2022-10-27 MED ORDER — AMOXICILLIN 875 MG PO TABS: 875.0000 mg | ORAL_TABLET | Freq: Two times a day (BID) | ORAL | 0 refills | Status: AC

## 2022-10-27 NOTE — Progress Notes (Signed)
   Subjective:    Patient ID: Hannah Day, female    DOB: 07-25-1961, 62 y.o.   MRN: [id number]  HPI L ear pain- sxs started ~4 days ago and has been progressively worsening.  No fever.  Pain is now radiating down neck and jaw.  + drainage.  No change in hearing.  Minimal congestion.   Review of Systems For ROS see HPI     Objective:   Physical Exam Vitals reviewed.  Constitutional:      General: She is not in acute distress.    Appearance: Normal appearance. She is not ill-appearing.  HENT:     Head: Normocephalic and atraumatic.     Right Ear: Tympanic membrane and ear canal normal.     Left Ear: Ear canal normal. A middle ear effusion is present. Tympanic membrane is erythematous.     Nose: No congestion or rhinorrhea.     Comments: No TTP over frontal or maxillary sinuses    Mouth/Throat:     Mouth: Mucous membranes are moist.     Pharynx: No oropharyngeal exudate or posterior oropharyngeal erythema.  Lymphadenopathy:     Cervical: Cervical adenopathy present.  Neurological:     Mental Status: She is alert.           Assessment & Plan:  L OM- new.  Pt's sxs and PE consistent w/ otitis media.  Start abx.  Encouraged her to add nasal steroid to her daily antihistamine.  Pt expressed understanding and is in agreement w/ plan.

## 2022-10-27 NOTE — Patient Instructions (Signed)
Follow up as needed or as scheduled START the Amoxicillin twice daily- take w/ food Drink LOTS of fluids CONTINUE the daily allergy medication ADD OTC Flonase or Nasonex to help open up that eustachian tube Call with any questions or concerns Hang in there!!!

## 2022-11-06 ENCOUNTER — Other Ambulatory Visit: Payer: Self-pay | Admitting: Hematology & Oncology

## 2022-11-06 DIAGNOSIS — I825Z2 Chronic embolism and thrombosis of unspecified deep veins of left distal lower extremity: Secondary | ICD-10-CM

## 2022-11-07 ENCOUNTER — Encounter: Payer: Self-pay | Admitting: Family Medicine

## 2022-11-07 ENCOUNTER — Ambulatory Visit (INDEPENDENT_AMBULATORY_CARE_PROVIDER_SITE_OTHER): Payer: 59 | Admitting: Family Medicine

## 2022-11-07 VITALS — BP 116/74 | HR 86 | Temp 97.4°F | Resp 17 | Ht 69.0 in | Wt 196.1 lb

## 2022-11-07 DIAGNOSIS — H66002 Acute suppurative otitis media without spontaneous rupture of ear drum, left ear: Secondary | ICD-10-CM | POA: Diagnosis not present

## 2022-11-07 MED ORDER — PREDNISONE 10 MG PO TABS: ORAL_TABLET | ORAL | 0 refills | Status: AC

## 2022-11-07 MED ORDER — AZITHROMYCIN 250 MG PO TABS: ORAL_TABLET | ORAL | 0 refills | Status: AC

## 2022-11-07 NOTE — Progress Notes (Signed)
   Acute Office Visit   Subjective:  Patient ID: Hannah Day, female    DOB: Nov 16, 1960, 62 y.o.   MRN: [id number]  Chief Complaint  Patient presents with   Ear Pain    Left ear pain  Pt finished her amoxicillin  Taking OTC allergy medicine     HPI Patient is complaining of left ear pain/pressure with intermittent throbbing. Also, reports thin, yellow drainage, feels swollen, and a little muffled hearing.   Denies fever or nasal congestion.  Patient was seen on 02/26 by Dr. Raliegh Ip. Tabori. Prescribed Amoxicillin 875mg  BID for 10 days. She reports she completed taking antibiotic. She reports the medication improved a little, but didn't completely resolve symptoms.  Also, been taking Mucinex-D.   ROS See HPI above     Objective:    BP 116/74   Pulse 86   Temp (!) 97.4 F (36.3 C) (Temporal)   Resp 17   Ht 5\' 9"  (1.753 m)   Wt 196 lb 2 oz (89 kg)   LMP 02/12/2011   SpO2 97%   BMI 28.96 kg/m    Physical Exam Vitals reviewed.  Constitutional:      General: She is not in acute distress.    Appearance: Normal appearance. She is not ill-appearing, toxic-appearing or diaphoretic.  HENT:     Head: Normocephalic and atraumatic.     Comments: Left preauricular tenderness and posterior auricular non-tenderness    Right Ear: Tympanic membrane normal. Tenderness present. There is no impacted cerumen.     Left Ear: There is no impacted cerumen.     Ears:     Comments: Minimal cerumen noted in left ear canal.  Neurological:     Mental Status: She is alert.      Assessment & Plan:  Acute suppurative otitis media of left ear without spontaneous rupture of tympanic membrane, recurrence not specified -     Azithromycin; Take 2 tablets on day 1, then 1 tablet daily on days 2 through 5  Dispense: 6 tablet; Refill: 0 -     predniSONE; Take 6 tablets (60 mg total) by mouth daily with breakfast for 1 day, THEN 5 tablets (50 mg total) daily with breakfast for 1 day, THEN 4 tablets (40 mg  total) daily with breakfast for 1 day, THEN 3 tablets (30 mg total) daily with breakfast for 1 day, THEN 2 tablets (20 mg total) daily with breakfast for 1 day, THEN 1 tablet (10 mg total) daily with breakfast for 1 day.  Dispense: 21 tablet; Refill: 0  -Follow up if not improved with either Dr. Raliegh Ip. Tabori or myself. If symptoms continue, may need to refer to ENT.   No follow-ups on file.  Valarie Merino, NP

## 2022-11-07 NOTE — Patient Instructions (Signed)
Prescribed Prednisone '10mg'$ , 6 day taper and Azithromycin 5 day pack.  If not improved, follow up with either Dr. Birdie Riddle or myself. May need to send to ENT if not improved.

## 2022-11-24 ENCOUNTER — Telehealth: Payer: Self-pay | Admitting: Family Medicine

## 2022-11-24 ENCOUNTER — Other Ambulatory Visit: Payer: Self-pay | Admitting: Family Medicine

## 2022-11-24 DIAGNOSIS — H66002 Acute suppurative otitis media without spontaneous rupture of ear drum, left ear: Secondary | ICD-10-CM

## 2022-11-24 MED ORDER — CIPROFLOXACIN-DEXAMETHASONE 0.3-0.1 % OT SUSP: 4.0000 [drp] | Freq: Two times a day (BID) | OTIC | 0 refills | Status: DC

## 2022-11-24 NOTE — Telephone Encounter (Signed)
I spoke to the pt and informed her that DR Birdie Riddle is out of the office this week . She states her left ear started draining again on Friday stopped up again . She is asking can she have a referral to ENT since she has seen Dr Birdie Riddle and Di Kindle in regards to this ?

## 2022-11-24 NOTE — Telephone Encounter (Signed)
Pt aware referral has been placed 

## 2022-11-24 NOTE — Telephone Encounter (Signed)
Caller name: KYASHA WISSEL  On DPR?: Yes  Call back number: [phone] (mobile)  Provider they see: Midge Minium, MD  Reason for call:  Patient called stating that she has another ear infection in her left ear. Patient seen Dr.Tabori and Di Kindle about this same concerns. Patient wants to know if she needs another appt or referral to a ENT.

## 2022-11-24 NOTE — Telephone Encounter (Signed)
Informed pt that the medication has been sent in

## 2022-12-02 ENCOUNTER — Other Ambulatory Visit: Payer: Self-pay | Admitting: Hematology & Oncology

## 2022-12-02 ENCOUNTER — Other Ambulatory Visit: Payer: Self-pay | Admitting: Family Medicine

## 2022-12-02 DIAGNOSIS — I825Z2 Chronic embolism and thrombosis of unspecified deep veins of left distal lower extremity: Secondary | ICD-10-CM

## 2022-12-02 DIAGNOSIS — Z0000 Encounter for general adult medical examination without abnormal findings: Secondary | ICD-10-CM

## 2022-12-02 DIAGNOSIS — E663 Overweight: Secondary | ICD-10-CM

## 2023-01-20 ENCOUNTER — Other Ambulatory Visit: Payer: 59

## 2023-01-27 ENCOUNTER — Inpatient Hospital Stay: Payer: 59 | Attending: Hematology & Oncology

## 2023-01-27 DIAGNOSIS — I82409 Acute embolism and thrombosis of unspecified deep veins of unspecified lower extremity: Secondary | ICD-10-CM

## 2023-01-27 DIAGNOSIS — Z86718 Personal history of other venous thrombosis and embolism: Secondary | ICD-10-CM | POA: Insufficient documentation

## 2023-01-27 DIAGNOSIS — Z7901 Long term (current) use of anticoagulants: Secondary | ICD-10-CM | POA: Insufficient documentation

## 2023-01-27 DIAGNOSIS — D6859 Other primary thrombophilia: Secondary | ICD-10-CM

## 2023-01-27 LAB — CMP (CANCER CENTER ONLY)
ALT: 14 U/L (ref 0–44)
AST: 20 U/L (ref 15–41)
Albumin: 4.5 g/dL (ref 3.5–5.0)
Alkaline Phosphatase: 76 U/L (ref 38–126)
Anion gap: 7 (ref 5–15)
BUN: 15 mg/dL (ref 8–23)
CO2: 26 mmol/L (ref 22–32)
Calcium: 9.7 mg/dL (ref 8.9–10.3)
Chloride: 107 mmol/L (ref 98–111)
Creatinine: 1.04 mg/dL — ABNORMAL HIGH (ref 0.44–1.00)
GFR, Estimated: >60 mL/min (ref >60)
Glucose, Bld: 93 mg/dL (ref 70–99)
Potassium: 4.3 mmol/L (ref 3.5–5.1)
Sodium: 140 mmol/L (ref 135–145)
Total Bilirubin: 0.6 mg/dL (ref 0.3–1.2)
Total Protein: 6.9 g/dL (ref 6.5–8.1)

## 2023-01-27 LAB — CBC WITH DIFFERENTIAL (CANCER CENTER ONLY)
Abs Immature Granulocytes: 0.01 10*3/uL (ref 0.00–0.07)
Basophils Absolute: 0.1 10*3/uL (ref 0.0–0.1)
Basophils Relative: 1 %
Eosinophils Absolute: 0.1 10*3/uL (ref 0.0–0.5)
Eosinophils Relative: 1 %
HCT: 42 % (ref 36.0–46.0)
Hemoglobin: 13.6 g/dL (ref 12.0–15.0)
Immature Granulocytes: 0 %
Lymphocytes Relative: 40 %
Lymphs Abs: 2.5 10*3/uL (ref 0.7–4.0)
MCH: 31.1 pg (ref 26.0–34.0)
MCHC: 32.4 g/dL (ref 30.0–36.0)
MCV: 95.9 fL (ref 80.0–100.0)
Monocytes Absolute: 0.5 10*3/uL (ref 0.1–1.0)
Monocytes Relative: 7 %
Neutro Abs: 3.1 10*3/uL (ref 1.7–7.7)
Neutrophils Relative %: 51 %
Platelet Count: 216 10*3/uL (ref 150–400)
RBC: 4.38 MIL/uL (ref 3.87–5.11)
RDW: 12.6 % (ref 11.5–15.5)
WBC Count: 6.2 10*3/uL (ref 4.0–10.5)
nRBC: 0 % (ref 0.0–0.2)

## 2023-01-27 LAB — D-DIMER, QUANTITATIVE: D-Dimer, Quant: 0.36 ug/mL-FEU (ref 0.00–0.50)

## 2023-01-28 LAB — LUPUS ANTICOAGULANT PANEL
DRVVT: 48.4 s — ABNORMAL HIGH (ref 0.0–47.0)
PTT Lupus Anticoagulant: 34.6 s (ref 0.0–43.5)

## 2023-01-28 LAB — DRVVT CONFIRM: dRVVT Confirm: 0.9 ratio (ref 0.8–1.2)

## 2023-01-28 LAB — DRVVT MIX: dRVVT Mix: 42.2 s — ABNORMAL HIGH (ref 0.0–40.4)

## 2023-01-31 ENCOUNTER — Other Ambulatory Visit: Payer: Self-pay | Admitting: Family Medicine

## 2023-01-31 ENCOUNTER — Other Ambulatory Visit: Payer: Self-pay | Admitting: Hematology & Oncology

## 2023-01-31 DIAGNOSIS — I825Z2 Chronic embolism and thrombosis of unspecified deep veins of left distal lower extremity: Secondary | ICD-10-CM

## 2023-01-31 DIAGNOSIS — M549 Dorsalgia, unspecified: Secondary | ICD-10-CM

## 2023-02-02 NOTE — Telephone Encounter (Signed)
Left vm stating refill sent  

## 2023-02-02 NOTE — Telephone Encounter (Signed)
Is It ok to refill medication below ?

## 2023-03-02 ENCOUNTER — Other Ambulatory Visit: Payer: Self-pay | Admitting: Hematology & Oncology

## 2023-03-02 DIAGNOSIS — I825Z2 Chronic embolism and thrombosis of unspecified deep veins of left distal lower extremity: Secondary | ICD-10-CM

## 2023-04-01 ENCOUNTER — Encounter (INDEPENDENT_AMBULATORY_CARE_PROVIDER_SITE_OTHER): Payer: Self-pay

## 2023-04-01 ENCOUNTER — Other Ambulatory Visit: Payer: Self-pay | Admitting: Hematology & Oncology

## 2023-04-01 ENCOUNTER — Other Ambulatory Visit: Payer: Self-pay | Admitting: Family Medicine

## 2023-04-01 DIAGNOSIS — I825Z2 Chronic embolism and thrombosis of unspecified deep veins of left distal lower extremity: Secondary | ICD-10-CM

## 2023-04-01 DIAGNOSIS — E663 Overweight: Secondary | ICD-10-CM

## 2023-04-01 DIAGNOSIS — Z0000 Encounter for general adult medical examination without abnormal findings: Secondary | ICD-10-CM

## 2023-04-15 ENCOUNTER — Encounter: Payer: Self-pay | Admitting: Family Medicine

## 2023-04-15 ENCOUNTER — Ambulatory Visit (INDEPENDENT_AMBULATORY_CARE_PROVIDER_SITE_OTHER): Payer: 59 | Admitting: Family Medicine

## 2023-04-15 VITALS — BP 116/74 | HR 63 | Temp 97.9°F | Resp 18 | Ht 69.0 in | Wt 200.1 lb

## 2023-04-15 DIAGNOSIS — E785 Hyperlipidemia, unspecified: Secondary | ICD-10-CM | POA: Diagnosis not present

## 2023-04-15 DIAGNOSIS — Z1231 Encounter for screening mammogram for malignant neoplasm of breast: Secondary | ICD-10-CM | POA: Diagnosis not present

## 2023-04-15 DIAGNOSIS — E663 Overweight: Secondary | ICD-10-CM | POA: Diagnosis not present

## 2023-04-15 DIAGNOSIS — Z6829 Body mass index (BMI) 29.0-29.9, adult: Secondary | ICD-10-CM

## 2023-04-15 LAB — LIPID PANEL
Cholesterol: 134 mg/dL (ref 0–200)
HDL: 51.7 mg/dL (ref >39.00)
LDL Cholesterol: 64 mg/dL (ref 0–99)
NonHDL: 82.42
Total CHOL/HDL Ratio: 3
Triglycerides: 90 mg/dL (ref 0.0–149.0)
VLDL: 18 mg/dL (ref 0.0–40.0)

## 2023-04-15 LAB — CBC WITH DIFFERENTIAL/PLATELET
Basophils Absolute: 0.1 10*3/uL (ref 0.0–0.1)
Basophils Relative: 0.9 % (ref 0.0–3.0)
Eosinophils Absolute: 0.1 10*3/uL (ref 0.0–0.7)
Eosinophils Relative: 2.1 % (ref 0.0–5.0)
HCT: 41.9 % (ref 36.0–46.0)
Hemoglobin: 13.5 g/dL (ref 12.0–15.0)
Lymphocytes Relative: 39 % (ref 12.0–46.0)
Lymphs Abs: 2.5 10*3/uL (ref 0.7–4.0)
MCHC: 32.1 g/dL (ref 30.0–36.0)
MCV: 93.9 fl (ref 78.0–100.0)
Monocytes Absolute: 0.5 10*3/uL (ref 0.1–1.0)
Monocytes Relative: 7.3 % (ref 3.0–12.0)
Neutro Abs: 3.2 10*3/uL (ref 1.4–7.7)
Neutrophils Relative %: 50.7 % (ref 43.0–77.0)
Platelets: 214 10*3/uL (ref 150.0–400.0)
RBC: 4.47 Mil/uL (ref 3.87–5.11)
RDW: 13.5 % (ref 11.5–15.5)
WBC: 6.4 10*3/uL (ref 4.0–10.5)

## 2023-04-15 LAB — BASIC METABOLIC PANEL
BUN: 16 mg/dL (ref 6–23)
CO2: 30 mEq/L (ref 19–32)
Calcium: 9.6 mg/dL (ref 8.4–10.5)
Chloride: 104 mEq/L (ref 96–112)
Creatinine, Ser: 0.97 mg/dL (ref 0.40–1.20)
GFR: 62.99 mL/min (ref >60.00)
Glucose, Bld: 94 mg/dL (ref 70–99)
Potassium: 4.7 mEq/L (ref 3.5–5.1)
Sodium: 137 mEq/L (ref 135–145)

## 2023-04-15 LAB — TSH: TSH: 2.85 u[IU]/mL (ref 0.35–5.50)

## 2023-04-15 LAB — HEPATIC FUNCTION PANEL
ALT: 21 U/L (ref 0–35)
AST: 26 U/L (ref 0–37)
Albumin: 4.1 g/dL (ref 3.5–5.2)
Alkaline Phosphatase: 70 U/L (ref 39–117)
Bilirubin, Direct: 0.1 mg/dL (ref 0.0–0.3)
Total Bilirubin: 0.5 mg/dL (ref 0.2–1.2)
Total Protein: 6.9 g/dL (ref 6.0–8.3)

## 2023-04-15 NOTE — Progress Notes (Signed)
   Subjective:    Patient ID: Hannah Day, female    DOB: [birth year], 62 y.o.   MRN: [id number]  HPI Hyperlipidemia- Chronic problem, on Crestor 10mg  daily.  No CP, SOB, abd pain, N/V.  Overweight- she has gained 5 lbs since last visit.  BMI now 29.55.  Walking regularly.  Plans to restart [employer].   Review of Systems For ROS see HPI     Objective:   Physical Exam Vitals reviewed.  Constitutional:      General: She is not in acute distress.    Appearance: Normal appearance. She is well-developed. She is not ill-appearing.  HENT:     Head: Normocephalic and atraumatic.  Eyes:     Conjunctiva/sclera: Conjunctivae normal.     Pupils: Pupils are equal, round, and reactive to light.  Neck:     Thyroid: No thyromegaly.  Cardiovascular:     Rate and Rhythm: Normal rate and regular rhythm.     Pulses: Normal pulses.     Heart sounds: Normal heart sounds. No murmur heard. Pulmonary:     Effort: Pulmonary effort is normal. No respiratory distress.     Breath sounds: Normal breath sounds.  Abdominal:     General: There is no distension.     Palpations: Abdomen is soft.     Tenderness: There is no abdominal tenderness.  Musculoskeletal:     Cervical back: Normal range of motion and neck supple.     Right lower leg: No edema.     Left lower leg: No edema.  Lymphadenopathy:     Cervical: No cervical adenopathy.  Skin:    General: Skin is warm and dry.  Neurological:     General: No focal deficit present.     Mental Status: She is alert and oriented to person, place, and time.  Psychiatric:        Mood and Affect: Mood normal.        Behavior: Behavior normal.           Assessment & Plan:

## 2023-04-15 NOTE — Assessment & Plan Note (Signed)
Chronic problem.  Currently on Crestor 10mg daily w/o difficulty.  Check labs.  Adjust meds prn  

## 2023-04-15 NOTE — Patient Instructions (Addendum)
Schedule your complete physical in 6 months We'll notify you of your lab results and make any changes if needed Continue to work on healthy diet and regular exercise- you can do it! Get your mammogram!!! Call with any questions or concerns Stay Safe!  Stay Healthy! Enjoy the rest of your summer!!

## 2023-04-15 NOTE — Assessment & Plan Note (Signed)
Pt has gained 5 lbs since last visit.  Plans to restart [employer].  Walking has been limited due to heat.  Will continue to follow.

## 2023-04-16 ENCOUNTER — Telehealth: Payer: Self-pay

## 2023-04-16 NOTE — Telephone Encounter (Signed)
Caller name: EVANGELYNE LACEWELL  On DPR?: Yes  Call back number: [phone] (mobile)  Provider they see: Sheliah Hatch, MD  Reason for call: Pt was received a VM regarding labs and asked for a return call

## 2023-04-16 NOTE — Telephone Encounter (Signed)
Left vm to call office

## 2023-04-16 NOTE — Telephone Encounter (Signed)
-----   Message from Neena Rhymes sent at 04/16/2023  7:32 AM EDT ----- Labs look great!  No changes at this time

## 2023-04-16 NOTE — Telephone Encounter (Signed)
Left vm about lab results

## 2023-05-01 ENCOUNTER — Other Ambulatory Visit: Payer: Self-pay | Admitting: Hematology & Oncology

## 2023-05-01 DIAGNOSIS — I825Z2 Chronic embolism and thrombosis of unspecified deep veins of left distal lower extremity: Secondary | ICD-10-CM

## 2023-05-08 ENCOUNTER — Ambulatory Visit
Admission: RE | Admit: 2023-05-08 | Discharge: 2023-05-08 | Disposition: A | Discharge status: Home / Self Care | Payer: 59 | Source: Ambulatory Visit | Attending: Family Medicine | Admitting: Family Medicine

## 2023-05-08 DIAGNOSIS — Z1231 Encounter for screening mammogram for malignant neoplasm of breast: Secondary | ICD-10-CM

## 2023-05-12 ENCOUNTER — Other Ambulatory Visit: Payer: Self-pay | Admitting: Family Medicine

## 2023-05-12 DIAGNOSIS — R928 Other abnormal and inconclusive findings on diagnostic imaging of breast: Secondary | ICD-10-CM

## 2023-05-20 ENCOUNTER — Ambulatory Visit
Admission: RE | Admit: 2023-05-20 | Discharge: 2023-05-20 | Disposition: A | Discharge status: Home / Self Care | Payer: 59 | Source: Ambulatory Visit | Attending: Family Medicine | Admitting: Family Medicine

## 2023-05-20 DIAGNOSIS — R928 Other abnormal and inconclusive findings on diagnostic imaging of breast: Secondary | ICD-10-CM

## 2023-05-28 ENCOUNTER — Other Ambulatory Visit: Payer: Self-pay | Admitting: Hematology & Oncology

## 2023-05-28 DIAGNOSIS — I825Z2 Chronic embolism and thrombosis of unspecified deep veins of left distal lower extremity: Secondary | ICD-10-CM

## 2023-06-30 ENCOUNTER — Other Ambulatory Visit: Payer: Self-pay | Admitting: Hematology & Oncology

## 2023-06-30 DIAGNOSIS — I825Z2 Chronic embolism and thrombosis of unspecified deep veins of left distal lower extremity: Secondary | ICD-10-CM

## 2023-07-23 ENCOUNTER — Other Ambulatory Visit: Payer: Self-pay

## 2023-07-23 DIAGNOSIS — I82409 Acute embolism and thrombosis of unspecified deep veins of unspecified lower extremity: Secondary | ICD-10-CM

## 2023-07-24 ENCOUNTER — Encounter: Payer: Self-pay | Admitting: Hematology & Oncology

## 2023-07-24 ENCOUNTER — Inpatient Hospital Stay (HOSPITAL_BASED_OUTPATIENT_CLINIC_OR_DEPARTMENT_OTHER): Payer: 59 | Admitting: Hematology & Oncology

## 2023-07-24 ENCOUNTER — Inpatient Hospital Stay: Payer: 59 | Attending: Hematology & Oncology

## 2023-07-24 VITALS — BP 123/70 | HR 62 | Temp 98.5°F | Resp 18 | Ht 69.0 in | Wt 198.1 lb

## 2023-07-24 DIAGNOSIS — D6862 Lupus anticoagulant syndrome: Secondary | ICD-10-CM | POA: Diagnosis present

## 2023-07-24 DIAGNOSIS — I82412 Acute embolism and thrombosis of left femoral vein: Secondary | ICD-10-CM | POA: Insufficient documentation

## 2023-07-24 DIAGNOSIS — Z86718 Personal history of other venous thrombosis and embolism: Secondary | ICD-10-CM | POA: Diagnosis not present

## 2023-07-24 DIAGNOSIS — Z7901 Long term (current) use of anticoagulants: Secondary | ICD-10-CM | POA: Insufficient documentation

## 2023-07-24 DIAGNOSIS — I82409 Acute embolism and thrombosis of unspecified deep veins of unspecified lower extremity: Secondary | ICD-10-CM

## 2023-07-24 HISTORY — DX: Acute embolism and thrombosis of left femoral vein: I82.412

## 2023-07-24 LAB — CBC WITH DIFFERENTIAL (CANCER CENTER ONLY)
Abs Immature Granulocytes: 0.02 10*3/uL (ref 0.00–0.07)
Basophils Absolute: 0.1 10*3/uL (ref 0.0–0.1)
Basophils Relative: 1 %
Eosinophils Absolute: 0.1 10*3/uL (ref 0.0–0.5)
Eosinophils Relative: 2 %
HCT: 41.4 % (ref 36.0–46.0)
Hemoglobin: 13.3 g/dL (ref 12.0–15.0)
Immature Granulocytes: 0 %
Lymphocytes Relative: 38 %
Lymphs Abs: 2.5 10*3/uL (ref 0.7–4.0)
MCH: 31 pg (ref 26.0–34.0)
MCHC: 32.1 g/dL (ref 30.0–36.0)
MCV: 96.5 fL (ref 80.0–100.0)
Monocytes Absolute: 0.5 10*3/uL (ref 0.1–1.0)
Monocytes Relative: 7 %
Neutro Abs: 3.4 10*3/uL (ref 1.7–7.7)
Neutrophils Relative %: 52 %
Platelet Count: 213 10*3/uL (ref 150–400)
RBC: 4.29 MIL/uL (ref 3.87–5.11)
RDW: 13.1 % (ref 11.5–15.5)
WBC Count: 6.7 10*3/uL (ref 4.0–10.5)
nRBC: 0 % (ref 0.0–0.2)

## 2023-07-24 LAB — CMP (CANCER CENTER ONLY)
ALT: 15 U/L (ref 0–44)
AST: 19 U/L (ref 15–41)
Albumin: 4.2 g/dL (ref 3.5–5.0)
Alkaline Phosphatase: 76 U/L (ref 38–126)
Anion gap: 7 (ref 5–15)
BUN: 14 mg/dL (ref 8–23)
CO2: 30 mmol/L (ref 22–32)
Calcium: 10.4 mg/dL — ABNORMAL HIGH (ref 8.9–10.3)
Chloride: 106 mmol/L (ref 98–111)
Creatinine: 1.04 mg/dL — ABNORMAL HIGH (ref 0.44–1.00)
GFR, Estimated: >60 mL/min (ref >60)
Glucose, Bld: 99 mg/dL (ref 70–99)
Potassium: 4.3 mmol/L (ref 3.5–5.1)
Sodium: 143 mmol/L (ref 135–145)
Total Bilirubin: 0.5 mg/dL (ref <1.2)
Total Protein: 7 g/dL (ref 6.5–8.1)

## 2023-07-24 LAB — D-DIMER, QUANTITATIVE: D-Dimer, Quant: 0.36 ug{FEU}/mL (ref 0.00–0.50)

## 2023-07-24 NOTE — Progress Notes (Signed)
Hematology and Oncology Follow Up Visit  Hannah Day [id number] [birth year] 62 y.o. 07/24/2023   Principle Diagnosis:  DVT of the left lower leg - resolved Lupus anticoagulant positive   Current Therapy:        Eliquis 2.5 mg by mouth BID - restart on 07/20/2017  Aspirin 81 mg by mouth daily - start in July 2018   Interim History:  Hannah Day is here today for follow-up.  We see her yearly.  She has been doing quite well.  She just got back from [place].  She goes up there for [employer].  She we will go back up there in January.  She is doing well on the Eliquis.  She is also on baby aspirin.  She has had no problems with leg swelling or pain.  She has had no cough or shortness of breath.  She has had no issues with COVID.Marland Kitchen   Of note, her last mammogram was back in September.  In the on the screening mammogram there is an issue with the left breast.  On diagnostic mammogram her exam looks fine.  That appear to be this abnormality was a cyst.  She does the Cologuard..  She has had no issues with nausea or vomiting.  She has had no fever.  Overall, I would say that her performance status is probably ECOG 0.    Medications:  Allergies as of 07/24/2023       Reactions   Sulfa Antibiotics Hives        Medication List        Accurate as of July 24, 2023  8:45 AM. If you have any questions, ask your nurse or doctor.          acetaminophen 500 MG tablet Commonly known as: TYLENOL Take 500 mg by mouth every 6 (six) hours as needed.   aspirin 81 MG tablet Take 1 tablet (81 mg total) daily by mouth.   doxycycline 50 MG capsule Commonly known as: VIBRAMYCIN Take 50 mg by mouth daily as needed.   Eliquis 2.5 MG Tabs tablet Generic drug: apixaban TAKE 1 TABLET BY MOUTH TWICE DAILY   pseudoephedrine-guaifenesin 60-600 MG 12 hr tablet Commonly known as: MUCINEX D Take 1 tablet by mouth every 12 (twelve) hours.   rosuvastatin 10 MG  tablet Commonly known as: CRESTOR TAKE 1 TABLET(10 MG) BY MOUTH AT BEDTIME   triamterene 100 MG capsule Commonly known as: DYRENIUM TAKE 1 CAPSULE(100 MG) BY MOUTH DAILY        Allergies:  Allergies  Allergen Reactions   Sulfa Antibiotics Hives    Past Medical History, Surgical history, Social history, and Family History were reviewed and updated.  Review of Systems: Review of Systems  Constitutional: Negative.   HENT: Negative.    Eyes: Negative.   Respiratory: Negative.    Cardiovascular: Negative.   Gastrointestinal: Negative.   Genitourinary: Negative.   Musculoskeletal: Negative.   Skin: Negative.   Neurological: Negative.   Endo/Heme/Allergies: Negative.   Psychiatric/Behavioral: Negative.       Physical Exam:  height is 5\' 9"  (1.753 m) and weight is 198 lb 1.9 oz (89.9 kg). Her oral temperature is 98.5 F (36.9 C). Her blood pressure is 123/70 and her pulse is 62. Her respiration is 18 and oxygen saturation is 100%.   Wt Readings from Last 3 Encounters:  07/24/23 198 lb 1.9 oz (89.9 kg)  04/15/23 200 lb 2 oz (90.8 kg)  11/07/22 196 lb 2 oz (  89 kg)    Physical Exam Vitals reviewed.  HENT:     Head: Normocephalic and atraumatic.  Eyes:     Pupils: Pupils are equal, round, and reactive to light.  Cardiovascular:     Rate and Rhythm: Normal rate and regular rhythm.     Heart sounds: Normal heart sounds.  Pulmonary:     Effort: Pulmonary effort is normal.     Breath sounds: Normal breath sounds.  Abdominal:     General: Bowel sounds are normal.     Palpations: Abdomen is soft.  Musculoskeletal:        General: No tenderness or deformity. Normal range of motion.     Cervical back: Normal range of motion.  Lymphadenopathy:     Cervical: No cervical adenopathy.  Skin:    General: Skin is warm and dry.     Findings: No erythema or rash.  Neurological:     Mental Status: She is alert and oriented to person, place, and time.  Psychiatric:         Behavior: Behavior normal.        Thought Content: Thought content normal.        Judgment: Judgment normal.      Lab Results  Component Value Date   WBC 6.7 07/24/2023   HGB 13.3 07/24/2023   HCT 41.4 07/24/2023   MCV 96.5 07/24/2023   PLT 213 07/24/2023   No results found for: "FERRITIN", "IRON", "TIBC", "UIBC", "IRONPCTSAT" Lab Results  Component Value Date   RBC 4.29 07/24/2023   No results found for: "KPAFRELGTCHN", "LAMBDASER", "KAPLAMBRATIO" No results found for: "IGGSERUM", "IGA", "IGMSERUM" No results found for: "TOTALPROTELP", "ALBUMINELP", "A1GS", "A2GS", "BETS", "BETA2SER", "GAMS", "MSPIKE", "SPEI"   Chemistry      Component Value Date/Time   NA 137 04/15/2023 0915   NA 145 07/20/2017 0926   NA 141 03/12/2016 0858   K 4.7 04/15/2023 0915   K 4.2 07/20/2017 0926   K 4.0 03/12/2016 0858   CL 104 04/15/2023 0915   CL 107 07/20/2017 0926   CO2 30 04/15/2023 0915   CO2 27 07/20/2017 0926   CO2 27 03/12/2016 0858   BUN 16 04/15/2023 0915   BUN 10 07/20/2017 0926   BUN 14.6 03/12/2016 0858   CREATININE 0.97 04/15/2023 0915   CREATININE 1.04 (H) 01/27/2023 1229   CREATININE 0.6 07/20/2017 0926   CREATININE 0.8 03/12/2016 0858      Component Value Date/Time   CALCIUM 9.6 04/15/2023 0915   CALCIUM 9.1 07/20/2017 0926   CALCIUM 9.5 03/12/2016 0858   ALKPHOS 70 04/15/2023 0915   ALKPHOS 77 07/20/2017 0926   ALKPHOS 92 03/12/2016 0858   AST 26 04/15/2023 0915   AST 20 01/27/2023 1229   AST 17 03/12/2016 0858   ALT 21 04/15/2023 0915   ALT 14 01/27/2023 1229   ALT 33 07/20/2017 0926   ALT 15 03/12/2016 0858   BILITOT 0.5 04/15/2023 0915   BILITOT 0.6 01/27/2023 1229   BILITOT 0.39 03/12/2016 0858       Impression and Plan: Hannah Day is a very pleasant 62 yo caucasian female with history of left lower extremity DVT as well as a transiently positive lupus anticoagulant.  When we last checked back in May, everything looked fine.  There is no lupus  anticoagulant.  She we will continue on the Eliquis and baby aspirin.  She has had no problems with this.  We will go ahead and get her back in another  year for follow-up.   Josph Macho, MD 11/22/20248:45 AM

## 2023-07-25 LAB — LUPUS ANTICOAGULANT PANEL
DRVVT: 45.1 s (ref 0.0–47.0)
PTT Lupus Anticoagulant: 31.6 s (ref 0.0–43.5)

## 2023-08-04 ENCOUNTER — Other Ambulatory Visit: Payer: Self-pay | Admitting: Family Medicine

## 2023-08-04 ENCOUNTER — Other Ambulatory Visit: Payer: Self-pay | Admitting: Hematology & Oncology

## 2023-08-04 DIAGNOSIS — I825Z2 Chronic embolism and thrombosis of unspecified deep veins of left distal lower extremity: Secondary | ICD-10-CM

## 2023-08-04 DIAGNOSIS — Z0000 Encounter for general adult medical examination without abnormal findings: Secondary | ICD-10-CM

## 2023-08-04 DIAGNOSIS — E663 Overweight: Secondary | ICD-10-CM

## 2023-08-04 DIAGNOSIS — M549 Dorsalgia, unspecified: Secondary | ICD-10-CM

## 2023-09-05 ENCOUNTER — Other Ambulatory Visit: Payer: Self-pay | Admitting: Hematology & Oncology

## 2023-09-05 DIAGNOSIS — I825Z2 Chronic embolism and thrombosis of unspecified deep veins of left distal lower extremity: Secondary | ICD-10-CM

## 2023-10-05 ENCOUNTER — Other Ambulatory Visit: Payer: Self-pay | Admitting: Hematology & Oncology

## 2023-10-05 DIAGNOSIS — I825Z2 Chronic embolism and thrombosis of unspecified deep veins of left distal lower extremity: Secondary | ICD-10-CM

## 2023-10-16 ENCOUNTER — Ambulatory Visit (INDEPENDENT_AMBULATORY_CARE_PROVIDER_SITE_OTHER): Payer: 59 | Admitting: Family Medicine

## 2023-10-16 ENCOUNTER — Other Ambulatory Visit (HOSPITAL_COMMUNITY)
Admission: RE | Admit: 2023-10-16 | Discharge: 2023-10-16 | Disposition: A | Discharge status: Home / Self Care | Payer: 59 | Source: Ambulatory Visit | Attending: Family Medicine | Admitting: Family Medicine

## 2023-10-16 ENCOUNTER — Encounter: Payer: Self-pay | Admitting: Family Medicine

## 2023-10-16 VITALS — BP 112/68 | HR 85 | Temp 97.7°F | Ht 67.0 in | Wt 201.8 lb

## 2023-10-16 DIAGNOSIS — Z0000 Encounter for general adult medical examination without abnormal findings: Secondary | ICD-10-CM

## 2023-10-16 DIAGNOSIS — Z124 Encounter for screening for malignant neoplasm of cervix: Secondary | ICD-10-CM

## 2023-10-16 DIAGNOSIS — E785 Hyperlipidemia, unspecified: Secondary | ICD-10-CM | POA: Diagnosis not present

## 2023-10-16 LAB — LIPID PANEL
Cholesterol: 139 mg/dL (ref 0–200)
HDL: 54.6 mg/dL (ref >39.00)
LDL Cholesterol: 63 mg/dL (ref 0–99)
NonHDL: 84.74
Total CHOL/HDL Ratio: 3
Triglycerides: 107 mg/dL (ref 0.0–149.0)
VLDL: 21.4 mg/dL (ref 0.0–40.0)

## 2023-10-16 LAB — HEPATIC FUNCTION PANEL
ALT: 14 U/L (ref 0–35)
AST: 20 U/L (ref 0–37)
Albumin: 4.1 g/dL (ref 3.5–5.2)
Alkaline Phosphatase: 73 U/L (ref 39–117)
Bilirubin, Direct: 0.1 mg/dL (ref 0.0–0.3)
Total Bilirubin: 0.5 mg/dL (ref 0.2–1.2)
Total Protein: 7 g/dL (ref 6.0–8.3)

## 2023-10-16 LAB — BASIC METABOLIC PANEL
BUN: 13 mg/dL (ref 6–23)
CO2: 28 meq/L (ref 19–32)
Calcium: 9.2 mg/dL (ref 8.4–10.5)
Chloride: 104 meq/L (ref 96–112)
Creatinine, Ser: 0.98 mg/dL (ref 0.40–1.20)
GFR: 62 mL/min (ref >60.00)
Glucose, Bld: 89 mg/dL (ref 70–99)
Potassium: 4.3 meq/L (ref 3.5–5.1)
Sodium: 140 meq/L (ref 135–145)

## 2023-10-16 LAB — CBC WITH DIFFERENTIAL/PLATELET
Basophils Absolute: 0.1 10*3/uL (ref 0.0–0.1)
Basophils Relative: 0.9 % (ref 0.0–3.0)
Eosinophils Absolute: 0.1 10*3/uL (ref 0.0–0.7)
Eosinophils Relative: 1.6 % (ref 0.0–5.0)
HCT: 41.2 % (ref 36.0–46.0)
Hemoglobin: 13.5 g/dL (ref 12.0–15.0)
Lymphocytes Relative: 39.2 % (ref 12.0–46.0)
Lymphs Abs: 2.7 10*3/uL (ref 0.7–4.0)
MCHC: 32.8 g/dL (ref 30.0–36.0)
MCV: 94.6 fL (ref 78.0–100.0)
Monocytes Absolute: 0.5 10*3/uL (ref 0.1–1.0)
Monocytes Relative: 7.1 % (ref 3.0–12.0)
Neutro Abs: 3.6 10*3/uL (ref 1.4–7.7)
Neutrophils Relative %: 51.2 % (ref 43.0–77.0)
Platelets: 212 10*3/uL (ref 150.0–400.0)
RBC: 4.35 Mil/uL (ref 3.87–5.11)
RDW: 13.3 % (ref 11.5–15.5)
WBC: 6.9 10*3/uL (ref 4.0–10.5)

## 2023-10-16 LAB — TSH: TSH: 3 u[IU]/mL (ref 0.35–5.50)

## 2023-10-16 NOTE — Assessment & Plan Note (Signed)
Pt's PE WNL w/ exception of BMI.  UTD on mammo, cologuard, Tdap.  Pap done today.  Check labs.  Anticipatory guidance provided.

## 2023-10-16 NOTE — Progress Notes (Signed)
   Subjective:    Patient ID: Hannah Day, female    DOB: 07/23/1961, 63 y.o.   MRN: [id number]  HPI CPE- UTD on mammo, cologuard, Tdap.  Due for pap.  Patient Care Team    Relationship Specialty Notifications Start End  Sheliah Hatch, MD PCP - General Family Medicine  09/21/15     Health Maintenance  Topic Date Due   HIV Screening  Never done   Hepatitis C Screening  Never done   Zoster Vaccines- Shingrix (1 of 2) Never done   COVID-19 Vaccine (3 - 2024-25 season) 05/03/2023   Cervical Cancer Screening (HPV/Pap Cotest)  10/13/2023   INFLUENZA VACCINE  12/01/2023 (Originally 04/02/2023)   MAMMOGRAM  05/07/2024   Fecal DNA (Cologuard)  09/13/2024   DTaP/Tdap/Td (2 - Td or Tdap) 01/07/2028   HPV VACCINES  Aged Out      Review of Systems Patient reports no vision/ hearing changes, adenopathy,fever, weight change,  persistant/recurrent hoarseness , swallowing issues, chest pain, palpitations, edema, persistant/recurrent cough, hemoptysis, dyspnea (rest/exertional/paroxysmal nocturnal), gastrointestinal bleeding (melena, rectal bleeding), abdominal pain, significant heartburn, bowel changes, GU symptoms (dysuria, hematuria, incontinence), Gyn symptoms (abnormal  bleeding, pain),  syncope, focal weakness, memory loss, numbness & tingling, skin/hair/nail changes, abnormal bruising or bleeding, anxiety, or depression.     Objective:   Physical Exam  General Appearance:    Alert, cooperative, no distress, appears stated age, obese  Head:    Normocephalic, without obvious abnormality, atraumatic  Eyes:    PERRL, conjunctiva/corneas clear, EOM's intact both eyes  Ears:    Normal TM's and external ear canals, both ears  Nose:   Nares normal, septum midline, mucosa normal, no drainage    or sinus tenderness  Throat:   Lips, mucosa, and tongue normal; teeth and gums normal  Neck:   Supple, symmetrical, trachea midline, no adenopathy;    Thyroid: no enlargement/tenderness/nodules   Back:     Symmetric, no curvature, ROM normal, no CVA tenderness  Lungs:     Clear to auscultation bilaterally, respirations unlabored  Chest Wall:    No tenderness or deformity   Heart:    Regular rate and rhythm, S1 and S2 normal, no murmur, rub   or gallop  Breast Exam:    Deferred to mammo  Abdomen:     Soft, non-tender, bowel sounds active all four quadrants,    no masses, no organomegaly  Genitalia:    External genitalia normal, cervix normal in appearance, no CMT, uterus in normal size and position, adnexa w/out mass or tenderness, mucosa pink and moist, no lesions or discharge present  Rectal:    Normal external appearance  Extremities:   Extremities normal, atraumatic, no cyanosis or edema  Pulses:   2+ and symmetric all extremities  Skin:   Skin color, texture, turgor normal, no rashes or lesions  Lymph nodes:   Cervical, supraclavicular, and axillary nodes normal  Neurologic:   CNII-XII intact, normal strength, sensation and reflexes    throughout          Assessment & Plan:

## 2023-10-16 NOTE — Patient Instructions (Signed)
Follow up in 6 months to recheck cholesterol We'll notify you of your lab results and make any changes if needed Continue to work on healthy diet and regular exercise- you're doing great! Call with any questions or concerns Stay Safe!  Stay Healthy! Happy Valentine's Day!

## 2023-10-19 ENCOUNTER — Telehealth: Payer: Self-pay

## 2023-10-19 ENCOUNTER — Encounter: Payer: Self-pay | Admitting: Family Medicine

## 2023-10-19 NOTE — Telephone Encounter (Signed)
-----   Message from Neena Rhymes sent at 10/19/2023  7:47 AM EST ----- Labs look great!  No changes at this time

## 2023-10-20 ENCOUNTER — Telehealth: Payer: Self-pay

## 2023-10-20 LAB — CYTOLOGY - PAP
Comment: NEGATIVE
Diagnosis: NEGATIVE
High risk HPV: NEGATIVE

## 2023-10-20 NOTE — Telephone Encounter (Signed)
-----   Message from Neena Rhymes sent at 10/20/2023  7:47 AM EST ----- Normal pap.  Great news!

## 2023-10-20 NOTE — Telephone Encounter (Signed)
 Pt has reviewed results via MyChart.

## 2023-10-22 ENCOUNTER — Telehealth: Payer: Self-pay

## 2023-10-22 NOTE — Telephone Encounter (Signed)
 Left vm to call office

## 2023-11-05 ENCOUNTER — Other Ambulatory Visit: Payer: Self-pay | Admitting: Hematology & Oncology

## 2023-11-05 DIAGNOSIS — I825Z2 Chronic embolism and thrombosis of unspecified deep veins of left distal lower extremity: Secondary | ICD-10-CM

## 2023-12-10 ENCOUNTER — Other Ambulatory Visit: Payer: Self-pay | Admitting: Hematology & Oncology

## 2023-12-10 ENCOUNTER — Other Ambulatory Visit: Payer: Self-pay | Admitting: Family Medicine

## 2023-12-10 DIAGNOSIS — I825Z2 Chronic embolism and thrombosis of unspecified deep veins of left distal lower extremity: Secondary | ICD-10-CM

## 2023-12-10 DIAGNOSIS — E663 Overweight: Secondary | ICD-10-CM

## 2023-12-10 DIAGNOSIS — Z0000 Encounter for general adult medical examination without abnormal findings: Secondary | ICD-10-CM

## 2024-01-10 ENCOUNTER — Other Ambulatory Visit: Payer: Self-pay | Admitting: Hematology & Oncology

## 2024-01-10 DIAGNOSIS — I825Z2 Chronic embolism and thrombosis of unspecified deep veins of left distal lower extremity: Secondary | ICD-10-CM

## 2024-01-21 ENCOUNTER — Ambulatory Visit: Payer: 59 | Admitting: Hematology & Oncology

## 2024-01-21 ENCOUNTER — Inpatient Hospital Stay: Payer: 59

## 2024-01-29 ENCOUNTER — Encounter: Payer: Self-pay | Admitting: Family

## 2024-01-29 ENCOUNTER — Inpatient Hospital Stay

## 2024-01-29 ENCOUNTER — Inpatient Hospital Stay: Admitting: Family

## 2024-01-29 ENCOUNTER — Ambulatory Visit: Admitting: Hematology & Oncology

## 2024-01-29 ENCOUNTER — Inpatient Hospital Stay: Attending: Family

## 2024-01-29 VITALS — BP 110/85 | HR 72 | Temp 98.5°F | Resp 18 | Wt 207.8 lb

## 2024-01-29 DIAGNOSIS — Z7982 Long term (current) use of aspirin: Secondary | ICD-10-CM | POA: Insufficient documentation

## 2024-01-29 DIAGNOSIS — Z86718 Personal history of other venous thrombosis and embolism: Secondary | ICD-10-CM | POA: Insufficient documentation

## 2024-01-29 DIAGNOSIS — D6859 Other primary thrombophilia: Secondary | ICD-10-CM | POA: Diagnosis not present

## 2024-01-29 DIAGNOSIS — I82412 Acute embolism and thrombosis of left femoral vein: Secondary | ICD-10-CM

## 2024-01-29 DIAGNOSIS — I82409 Acute embolism and thrombosis of unspecified deep veins of unspecified lower extremity: Secondary | ICD-10-CM | POA: Diagnosis not present

## 2024-01-29 DIAGNOSIS — Z7901 Long term (current) use of anticoagulants: Secondary | ICD-10-CM | POA: Insufficient documentation

## 2024-01-29 DIAGNOSIS — D6862 Lupus anticoagulant syndrome: Secondary | ICD-10-CM | POA: Insufficient documentation

## 2024-01-29 LAB — CBC WITH DIFFERENTIAL (CANCER CENTER ONLY)
Abs Immature Granulocytes: 0.01 10*3/uL (ref 0.00–0.07)
Basophils Absolute: 0.1 10*3/uL (ref 0.0–0.1)
Basophils Relative: 1 %
Eosinophils Absolute: 0.2 10*3/uL (ref 0.0–0.5)
Eosinophils Relative: 2 %
HCT: 41.7 % (ref 36.0–46.0)
Hemoglobin: 13.4 g/dL (ref 12.0–15.0)
Immature Granulocytes: 0 %
Lymphocytes Relative: 37 %
Lymphs Abs: 2.8 10*3/uL (ref 0.7–4.0)
MCH: 30.2 pg (ref 26.0–34.0)
MCHC: 32.1 g/dL (ref 30.0–36.0)
MCV: 94.1 fL (ref 80.0–100.0)
Monocytes Absolute: 0.7 10*3/uL (ref 0.1–1.0)
Monocytes Relative: 9 %
Neutro Abs: 3.9 10*3/uL (ref 1.7–7.7)
Neutrophils Relative %: 51 %
Platelet Count: 207 10*3/uL (ref 150–400)
RBC: 4.43 MIL/uL (ref 3.87–5.11)
RDW: 13.2 % (ref 11.5–15.5)
WBC Count: 7.5 10*3/uL (ref 4.0–10.5)
nRBC: 0 % (ref 0.0–0.2)

## 2024-01-29 LAB — CMP (CANCER CENTER ONLY)
ALT: 15 U/L (ref 0–44)
AST: 19 U/L (ref 15–41)
Albumin: 4.4 g/dL (ref 3.5–5.0)
Alkaline Phosphatase: 66 U/L (ref 38–126)
Anion gap: 6 (ref 5–15)
BUN: 17 mg/dL (ref 8–23)
CO2: 29 mmol/L (ref 22–32)
Calcium: 9.6 mg/dL (ref 8.9–10.3)
Chloride: 107 mmol/L (ref 98–111)
Creatinine: 1.06 mg/dL — ABNORMAL HIGH (ref 0.44–1.00)
GFR, Estimated: 59 mL/min — ABNORMAL LOW (ref >60)
Glucose, Bld: 100 mg/dL — ABNORMAL HIGH (ref 70–99)
Potassium: 4.7 mmol/L (ref 3.5–5.1)
Sodium: 142 mmol/L (ref 135–145)
Total Bilirubin: 0.5 mg/dL (ref 0.0–1.2)
Total Protein: 7.1 g/dL (ref 6.5–8.1)

## 2024-01-29 NOTE — Progress Notes (Signed)
 Hematology and Oncology Follow Up Visit  Hannah Day [id number] 26-Apr-1961 63 y.o. 01/29/2024   Principle Diagnosis:  DVT of the left lower leg - resolved Lupus anticoagulant positive   Current Therapy:        Eliquis  2.5 mg by mouth BID - restart on 07/20/2017  Aspirin  81 mg by mouth daily - start in July 2018   Interim History:  Hannah Day is here today for follow-up. She is doing well and has no complaints at this time.  No issues with bleeding, bruising or petechiae on Eliquis  and baby aspirin . She verbalized that she is taking as prescribed.  No fever, chills, n/v, cough, rash, dizziness, SOB, chest pain, palpitations, abdominal pain or changes in bowel or bladder habits.  No tenderness, numbness or tingling in her extremities.  Chronic mild swelling in her feet and ankles is stable to improved. Pedal pulses are 1+. No falls or syncope reported.  Appetite and hydration are good. Weight is stable at 207 lbs.   ECOG Performance Status: 1 - Symptomatic but completely ambulatory  Medications:  Allergies as of 01/29/2024       Reactions   Sulfa Antibiotics Hives        Medication List        Accurate as of Jan 29, 2024  9:03 AM. If you have any questions, ask your nurse or doctor.          acetaminophen  500 MG tablet Commonly known as: TYLENOL  Take 500 mg by mouth every 6 (six) hours as needed.   aspirin  81 MG tablet Take 1 tablet (81 mg total) daily by mouth.   doxycycline 50 MG capsule Commonly known as: VIBRAMYCIN Take 50 mg by mouth daily as needed.   Eliquis  2.5 MG Tabs tablet Generic drug: apixaban  TAKE 1 TABLET BY MOUTH TWICE DAILY   Finacea 15 % Foam Generic drug: Azelaic Acid Apply topically 2 (two) times daily as needed.   pseudoephedrine-guaifenesin 60-600 MG 12 hr tablet Commonly known as: MUCINEX D Take 1 tablet by mouth every 12 (twelve) hours.   rosuvastatin  10 MG tablet Commonly known as: CRESTOR  TAKE 1 TABLET(10 MG) BY MOUTH  AT BEDTIME   triamterene  100 MG capsule Commonly known as: DYRENIUM  TAKE 1 CAPSULE(100 MG) BY MOUTH DAILY        Allergies:  Allergies  Allergen Reactions   Sulfa Antibiotics Hives    Past Medical History, Surgical history, Social history, and Family History were reviewed and updated.  Review of Systems: All other 10 point review of systems is negative.   Physical Exam:  vitals were not taken for this visit.   Wt Readings from Last 3 Encounters:  10/16/23 201 lb 12.8 oz (91.5 kg)  07/24/23 198 lb 1.9 oz (89.9 kg)  04/15/23 200 lb 2 oz (90.8 kg)    Ocular: Sclerae unicteric, pupils equal, round and reactive to light Ear-nose-throat: Oropharynx clear, dentition fair Lymphatic: No cervical or supraclavicular adenopathy Lungs no rales or rhonchi, good excursion bilaterally Heart regular rate and rhythm, no murmur appreciated Abd soft, nontender, positive bowel sounds MSK no focal spinal tenderness, no joint edema Neuro: non-focal, well-oriented, appropriate affect Breasts: Deferred   Lab Results  Component Value Date   WBC 6.9 10/16/2023   HGB 13.5 10/16/2023   HCT 41.2 10/16/2023   MCV 94.6 10/16/2023   PLT 212.0 10/16/2023   No results found for: "FERRITIN", "IRON", "TIBC", "UIBC", "IRONPCTSAT" Lab Results  Component Value Date   RBC 4.35 10/16/2023  No results found for: "KPAFRELGTCHN", "LAMBDASER", "KAPLAMBRATIO" No results found for: "IGGSERUM", "IGA", "IGMSERUM" No results found for: "TOTALPROTELP", "ALBUMINELP", "A1GS", "A2GS", "BETS", "BETA2SER", "GAMS", "MSPIKE", "SPEI"   Chemistry      Component Value Date/Time   NA 140 10/16/2023 1040   NA 145 07/20/2017 0926   NA 141 03/12/2016 0858   K 4.3 10/16/2023 1040   K 4.2 07/20/2017 0926   K 4.0 03/12/2016 0858   CL 104 10/16/2023 1040   CL 107 07/20/2017 0926   CO2 28 10/16/2023 1040   CO2 27 07/20/2017 0926   CO2 27 03/12/2016 0858   BUN 13 10/16/2023 1040   BUN 10 07/20/2017 0926   BUN 14.6  03/12/2016 0858   CREATININE 0.98 10/16/2023 1040   CREATININE 1.04 (H) 07/24/2023 0802   CREATININE 0.6 07/20/2017 0926   CREATININE 0.8 03/12/2016 0858      Component Value Date/Time   CALCIUM  9.2 10/16/2023 1040   CALCIUM  9.1 07/20/2017 0926   CALCIUM  9.5 03/12/2016 0858   ALKPHOS 73 10/16/2023 1040   ALKPHOS 77 07/20/2017 0926   ALKPHOS 92 03/12/2016 0858   AST 20 10/16/2023 1040   AST 19 07/24/2023 0802   AST 17 03/12/2016 0858   ALT 14 10/16/2023 1040   ALT 15 07/24/2023 0802   ALT 33 07/20/2017 0926   ALT 15 03/12/2016 0858   BILITOT 0.5 10/16/2023 1040   BILITOT 0.5 07/24/2023 0802   BILITOT 0.39 03/12/2016 0858       Impression and Plan:  Hannah Day is a very pleasant 63 yo caucasian female with history of left lower extremity DVT as well as a transiently positive lupus anticoagulant.  Lupus anticoagulant was negative back in 07/2023. Today's result is pending.  She remains stable on Eliquis  and 1 baby aspirin  daily. No changes.  Follow-up in 1 year.   Kennard Pea, NP 5/30/20259:03 AM

## 2024-01-30 LAB — LUPUS ANTICOAGULANT PANEL
DRVVT: 51.7 s — ABNORMAL HIGH (ref 0.0–47.0)
PTT Lupus Anticoagulant: 31.9 s (ref 0.0–43.5)

## 2024-01-30 LAB — DRVVT CONFIRM: dRVVT Confirm: 1 ratio (ref 0.8–1.2)

## 2024-01-30 LAB — DRVVT MIX: dRVVT Mix: 44.2 s — ABNORMAL HIGH (ref 0.0–40.4)

## 2024-02-10 ENCOUNTER — Other Ambulatory Visit: Payer: Self-pay | Admitting: Family Medicine

## 2024-02-10 DIAGNOSIS — M549 Dorsalgia, unspecified: Secondary | ICD-10-CM

## 2024-02-17 ENCOUNTER — Other Ambulatory Visit: Payer: Self-pay | Admitting: Hematology & Oncology

## 2024-02-17 DIAGNOSIS — I825Z2 Chronic embolism and thrombosis of unspecified deep veins of left distal lower extremity: Secondary | ICD-10-CM

## 2024-03-16 ENCOUNTER — Other Ambulatory Visit: Payer: Self-pay | Admitting: Hematology & Oncology

## 2024-03-16 DIAGNOSIS — I825Z2 Chronic embolism and thrombosis of unspecified deep veins of left distal lower extremity: Secondary | ICD-10-CM

## 2024-03-17 ENCOUNTER — Other Ambulatory Visit: Payer: Self-pay

## 2024-03-17 DIAGNOSIS — E663 Overweight: Secondary | ICD-10-CM

## 2024-03-17 DIAGNOSIS — Z0000 Encounter for general adult medical examination without abnormal findings: Secondary | ICD-10-CM

## 2024-03-17 MED ORDER — ROSUVASTATIN CALCIUM 10 MG PO TABS: 10.0000 mg | ORAL_TABLET | Freq: Every day | ORAL | 1 refills | Status: AC

## 2024-04-13 ENCOUNTER — Other Ambulatory Visit: Payer: Self-pay | Admitting: Hematology & Oncology

## 2024-04-13 DIAGNOSIS — I825Z2 Chronic embolism and thrombosis of unspecified deep veins of left distal lower extremity: Secondary | ICD-10-CM

## 2024-04-15 ENCOUNTER — Ambulatory Visit: Payer: 59 | Admitting: Family Medicine

## 2024-04-19 ENCOUNTER — Ambulatory Visit (INDEPENDENT_AMBULATORY_CARE_PROVIDER_SITE_OTHER): Admitting: Family Medicine

## 2024-04-19 ENCOUNTER — Encounter: Payer: Self-pay | Admitting: Family Medicine

## 2024-04-19 VITALS — BP 120/74 | HR 69 | Temp 97.8°F | Ht 67.0 in | Wt 206.2 lb

## 2024-04-19 DIAGNOSIS — E669 Obesity, unspecified: Secondary | ICD-10-CM | POA: Diagnosis not present

## 2024-04-19 DIAGNOSIS — E785 Hyperlipidemia, unspecified: Secondary | ICD-10-CM | POA: Diagnosis not present

## 2024-04-19 DIAGNOSIS — I10 Essential (primary) hypertension: Secondary | ICD-10-CM | POA: Insufficient documentation

## 2024-04-19 LAB — CBC WITH DIFFERENTIAL/PLATELET
Basophils Absolute: 0.1 K/uL (ref 0.0–0.1)
Basophils Relative: 1 % (ref 0.0–3.0)
Eosinophils Absolute: 0.2 K/uL (ref 0.0–0.7)
Eosinophils Relative: 2.7 % (ref 0.0–5.0)
HCT: 41.1 % (ref 36.0–46.0)
Hemoglobin: 13.5 g/dL (ref 12.0–15.0)
Lymphocytes Relative: 38 % (ref 12.0–46.0)
Lymphs Abs: 2.2 K/uL (ref 0.7–4.0)
MCHC: 32.8 g/dL (ref 30.0–36.0)
MCV: 92.6 fl (ref 78.0–100.0)
Monocytes Absolute: 0.5 K/uL (ref 0.1–1.0)
Monocytes Relative: 7.7 % (ref 3.0–12.0)
Neutro Abs: 3 K/uL (ref 1.4–7.7)
Neutrophils Relative %: 50.6 % (ref 43.0–77.0)
Platelets: 204 K/uL (ref 150.0–400.0)
RBC: 4.44 Mil/uL (ref 3.87–5.11)
RDW: 13.5 % (ref 11.5–15.5)
WBC: 5.9 K/uL (ref 4.0–10.5)

## 2024-04-19 LAB — HEPATIC FUNCTION PANEL
ALT: 17 U/L (ref 0–35)
AST: 23 U/L (ref 0–37)
Albumin: 4 g/dL (ref 3.5–5.2)
Alkaline Phosphatase: 63 U/L (ref 39–117)
Bilirubin, Direct: 0.1 mg/dL (ref 0.0–0.3)
Total Bilirubin: 0.5 mg/dL (ref 0.2–1.2)
Total Protein: 6.8 g/dL (ref 6.0–8.3)

## 2024-04-19 LAB — TSH: TSH: 2.41 u[IU]/mL (ref 0.35–5.50)

## 2024-04-19 LAB — LIPID PANEL
Cholesterol: 131 mg/dL (ref 0–200)
HDL: 56 mg/dL (ref >39.00)
LDL Cholesterol: 61 mg/dL (ref 0–99)
NonHDL: 74.89
Total CHOL/HDL Ratio: 2
Triglycerides: 70 mg/dL (ref 0.0–149.0)
VLDL: 14 mg/dL (ref 0.0–40.0)

## 2024-04-19 LAB — BASIC METABOLIC PANEL WITH GFR
BUN: 14 mg/dL (ref 6–23)
CO2: 28 meq/L (ref 19–32)
Calcium: 9.2 mg/dL (ref 8.4–10.5)
Chloride: 106 meq/L (ref 96–112)
Creatinine, Ser: 1.08 mg/dL (ref 0.40–1.20)
GFR: 54.98 mL/min — ABNORMAL LOW (ref >60.00)
Glucose, Bld: 93 mg/dL (ref 70–99)
Potassium: 4.6 meq/L (ref 3.5–5.1)
Sodium: 141 meq/L (ref 135–145)

## 2024-04-19 NOTE — Assessment & Plan Note (Signed)
 Chronic problem.  On Crestor  10mg  daily w/o difficulty.  Check labs.  Adjust meds prn

## 2024-04-19 NOTE — Assessment & Plan Note (Signed)
 Chronic problem.  On Triamterene  100mg  daily w/ good control.  Currently asymptomatic.  Check labs but no anticipated med changes.

## 2024-04-19 NOTE — Assessment & Plan Note (Signed)
 Ongoing issue.  Weight and BMI are stable- currently 32.3  Will walk as weather allows.  Encouraged her to follow a low carb diet and exercise regularly.  Will follow.

## 2024-04-19 NOTE — Progress Notes (Signed)
   Subjective:    Patient ID: Hannah Day, female    DOB: 02-07-1961, 63 y.o.   MRN: [id number]  HPI Hyperlipidemia- chronic problem, on Crestor  10mg  daily.  No abd pain, N/V.  HTN- chronic problem, on Triamterene  100mg  daily.  No CP, SOB, HA's, visual changes, edema.  Obesity- ongoing issue.  Weight is stable.  BMI 32.3  Continues to walk as weather allows.   Review of Systems For ROS see HPI     Objective:   Physical Exam Vitals reviewed.  Constitutional:      General: She is not in acute distress.    Appearance: Normal appearance. She is well-developed. She is not ill-appearing.  HENT:     Head: Normocephalic and atraumatic.  Eyes:     Conjunctiva/sclera: Conjunctivae normal.     Pupils: Pupils are equal, round, and reactive to light.  Neck:     Thyroid : No thyromegaly.  Cardiovascular:     Rate and Rhythm: Normal rate and regular rhythm.     Pulses: Normal pulses.     Heart sounds: Normal heart sounds. No murmur heard. Pulmonary:     Effort: Pulmonary effort is normal. No respiratory distress.     Breath sounds: Normal breath sounds.  Abdominal:     General: There is no distension.     Palpations: Abdomen is soft.     Tenderness: There is no abdominal tenderness.  Musculoskeletal:     Cervical back: Normal range of motion and neck supple.     Right lower leg: No edema.     Left lower leg: No edema.  Lymphadenopathy:     Cervical: No cervical adenopathy.  Skin:    General: Skin is warm and dry.  Neurological:     General: No focal deficit present.     Mental Status: She is alert and oriented to person, place, and time.  Psychiatric:        Mood and Affect: Mood normal.        Behavior: Behavior normal.        Thought Content: Thought content normal.           Assessment & Plan:

## 2024-04-19 NOTE — Patient Instructions (Signed)
 Schedule your complete physical in 6 months We'll notify you of your lab results and make any changes if needed Continue to work on healthy diet and regular exercise Call with any questions or concerns Stay safe!  Stay healthy! Enjoy the rest of your summer!!!

## 2024-04-20 ENCOUNTER — Ambulatory Visit: Payer: Self-pay | Admitting: Family Medicine

## 2024-05-15 ENCOUNTER — Other Ambulatory Visit: Payer: Self-pay | Admitting: Hematology & Oncology

## 2024-05-15 DIAGNOSIS — I825Z2 Chronic embolism and thrombosis of unspecified deep veins of left distal lower extremity: Secondary | ICD-10-CM

## 2024-06-16 ENCOUNTER — Other Ambulatory Visit: Payer: Self-pay | Admitting: Hematology & Oncology

## 2024-06-16 DIAGNOSIS — I825Z2 Chronic embolism and thrombosis of unspecified deep veins of left distal lower extremity: Secondary | ICD-10-CM

## 2024-07-16 ENCOUNTER — Other Ambulatory Visit: Payer: Self-pay | Admitting: Hematology & Oncology

## 2024-07-16 DIAGNOSIS — I825Z2 Chronic embolism and thrombosis of unspecified deep veins of left distal lower extremity: Secondary | ICD-10-CM

## 2024-08-16 ENCOUNTER — Other Ambulatory Visit: Payer: Self-pay | Admitting: Hematology & Oncology

## 2024-08-16 ENCOUNTER — Other Ambulatory Visit: Payer: Self-pay

## 2024-08-16 DIAGNOSIS — M549 Dorsalgia, unspecified: Secondary | ICD-10-CM

## 2024-08-16 DIAGNOSIS — I825Z2 Chronic embolism and thrombosis of unspecified deep veins of left distal lower extremity: Secondary | ICD-10-CM

## 2024-08-16 MED ORDER — TRIAMTERENE 100 MG PO CAPS: ORAL_CAPSULE | ORAL | 1 refills | Status: AC

## 2024-08-22 ENCOUNTER — Ambulatory Visit: Admitting: Family Medicine

## 2024-08-26 ENCOUNTER — Ambulatory Visit: Admitting: Family Medicine

## 2024-08-26 ENCOUNTER — Encounter: Payer: Self-pay | Admitting: Family Medicine

## 2024-08-26 VITALS — BP 126/84 | HR 65 | Temp 97.9°F | Resp 14 | Ht 67.0 in | Wt 203.8 lb

## 2024-08-26 DIAGNOSIS — H6991 Unspecified Eustachian tube disorder, right ear: Secondary | ICD-10-CM

## 2024-08-26 MED ORDER — PREDNISONE 10 MG PO TABS: ORAL_TABLET | ORAL | 0 refills | Status: AC

## 2024-08-26 NOTE — Patient Instructions (Signed)
 Follow up as needed or as scheduled START OTC nasal spray like Flonase or Nasonex START the Prednisone  as directed- 3 pills at the same time x3 days, then 2 pills at the same time x3 days, then 1 pill daily.  Take w/ food  Drink LOTS of fluids Call with any questions or concerns Stay Safe!  Stay Healthy! Happy New Year!!!

## 2024-08-26 NOTE — Progress Notes (Signed)
" ° °  Subjective:    Patient ID: Hannah Day, female    DOB: 1961/05/04, 63 y.o.   MRN: [id number]  HPI R ear pain- pt reports she had a cold that lasted for a couple of weeks.  Has some residual congestion but has noticed the last 2 days develops ear fullness/congestion on R side.  No drainage from ear.  Starts day pain free but pain develops as day goes on.  No fever.  Denies sinus pain/pressure.   Review of Systems For ROS see HPI     Objective:   Physical Exam Vitals reviewed.  Constitutional:      General: She is not in acute distress.    Appearance: Normal appearance. She is not ill-appearing.  HENT:     Head: Normocephalic and atraumatic.     Right Ear: Tympanic membrane is retracted.     Left Ear: Tympanic membrane and ear canal normal.     Nose: Congestion present. No rhinorrhea.     Right Sinus: No maxillary sinus tenderness or frontal sinus tenderness.     Left Sinus: No maxillary sinus tenderness or frontal sinus tenderness.     Mouth/Throat:     Mouth: Mucous membranes are moist.     Pharynx: No oropharyngeal exudate or posterior oropharyngeal erythema.  Eyes:     Extraocular Movements: Extraocular movements intact.     Conjunctiva/sclera: Conjunctivae normal.  Musculoskeletal:     Cervical back: Neck supple.  Lymphadenopathy:     Cervical: No cervical adenopathy.  Skin:    General: Skin is warm and dry.  Neurological:     General: No focal deficit present.     Mental Status: She is alert and oriented to person, place, and time.  Psychiatric:        Mood and Affect: Mood normal.        Behavior: Behavior normal.        Thought Content: Thought content normal.           Assessment & Plan:  R eustachian tube dysfxn- new.  Reviewed dx w/ pt and will start Flonase and low dose Prednisone  taper to tx residual nasal congestion that is likely contributing to her sxs.  Pt expressed understanding and is in agreement w/ plan.   "

## 2024-09-14 ENCOUNTER — Other Ambulatory Visit: Payer: Self-pay | Admitting: Hematology & Oncology

## 2024-09-14 DIAGNOSIS — I825Z2 Chronic embolism and thrombosis of unspecified deep veins of left distal lower extremity: Secondary | ICD-10-CM

## 2024-10-21 ENCOUNTER — Encounter: Admitting: Family Medicine

## 2025-01-27 ENCOUNTER — Ambulatory Visit: Admitting: Family

## 2025-01-27 ENCOUNTER — Inpatient Hospital Stay
